# Patient Record
Sex: Female | Born: 1935 | Race: Black or African American | Hispanic: No | Smoking: Never smoker
Health system: Southern US, Community
[De-identification: ages and names within clinical notes are randomized; demographics above are authoritative.]

## PROBLEM LIST (undated history)

## (undated) DIAGNOSIS — F0390 Unspecified dementia without behavioral disturbance: Secondary | ICD-10-CM

## (undated) DIAGNOSIS — I2510 Atherosclerotic heart disease of native coronary artery without angina pectoris: Secondary | ICD-10-CM

## (undated) DIAGNOSIS — F209 Schizophrenia, unspecified: Secondary | ICD-10-CM

## (undated) HISTORY — PX: ABDOMINAL HYSTERECTOMY: SHX81

---

## 2005-07-31 ENCOUNTER — Emergency Department: Payer: Self-pay | Admitting: Emergency Medicine

## 2006-04-08 ENCOUNTER — Emergency Department: Payer: Self-pay | Admitting: Emergency Medicine

## 2006-04-08 ENCOUNTER — Other Ambulatory Visit: Payer: Self-pay

## 2009-03-07 ENCOUNTER — Inpatient Hospital Stay: Payer: Self-pay | Admitting: Internal Medicine

## 2009-06-21 ENCOUNTER — Ambulatory Visit: Payer: Self-pay | Admitting: Internal Medicine

## 2009-10-29 ENCOUNTER — Emergency Department: Payer: Self-pay | Admitting: Internal Medicine

## 2010-06-26 ENCOUNTER — Ambulatory Visit: Payer: Self-pay | Admitting: Internal Medicine

## 2011-01-08 ENCOUNTER — Ambulatory Visit: Payer: Self-pay | Admitting: Internal Medicine

## 2011-02-02 ENCOUNTER — Emergency Department: Payer: Self-pay | Admitting: Unknown Physician Specialty

## 2011-02-10 ENCOUNTER — Observation Stay: Payer: Self-pay | Admitting: Internal Medicine

## 2011-06-20 ENCOUNTER — Ambulatory Visit: Payer: Self-pay | Admitting: Gastroenterology

## 2011-07-04 ENCOUNTER — Ambulatory Visit: Payer: Self-pay | Admitting: Internal Medicine

## 2012-01-07 ENCOUNTER — Ambulatory Visit: Payer: Self-pay | Admitting: Ophthalmology

## 2012-01-07 LAB — POTASSIUM: Potassium: 3.7 mmol/L (ref 3.5–5.1)

## 2012-01-21 ENCOUNTER — Ambulatory Visit: Payer: Self-pay | Admitting: Ophthalmology

## 2012-07-30 ENCOUNTER — Ambulatory Visit: Payer: Self-pay

## 2013-05-14 ENCOUNTER — Emergency Department: Payer: Self-pay | Admitting: Emergency Medicine

## 2013-05-14 LAB — URINALYSIS, COMPLETE
Bacteria: NONE SEEN
Bilirubin,UR: NEGATIVE
Blood: NEGATIVE
Nitrite: NEGATIVE
Ph: 7 (ref 4.5–8.0)
RBC,UR: 1 /HPF (ref 0–5)
Specific Gravity: 1.005 (ref 1.003–1.030)

## 2013-05-14 LAB — CBC
HGB: 12 g/dL (ref 12.0–16.0)
MCH: 30 pg (ref 26.0–34.0)
MCHC: 33.3 g/dL (ref 32.0–36.0)
MCV: 90 fL (ref 80–100)
Platelet: 251 10*3/uL (ref 150–440)
WBC: 5.2 10*3/uL (ref 3.6–11.0)

## 2013-05-14 LAB — TROPONIN I
Troponin-I: <0.02 ng/mL
Troponin-I: <0.02 ng/mL

## 2013-05-14 LAB — COMPREHENSIVE METABOLIC PANEL
Albumin: 3.4 g/dL (ref 3.4–5.0)
Alkaline Phosphatase: 92 U/L (ref 50–136)
Anion Gap: 2 — ABNORMAL LOW (ref 7–16)
Calcium, Total: 8.7 mg/dL (ref 8.5–10.1)
Chloride: 105 mmol/L (ref 98–107)
Creatinine: 0.91 mg/dL (ref 0.60–1.30)
EGFR (African American): >60
Glucose: 82 mg/dL (ref 65–99)
Potassium: 3.6 mmol/L (ref 3.5–5.1)
SGOT(AST): 19 U/L (ref 15–37)
SGPT (ALT): 16 U/L (ref 12–78)
Sodium: 140 mmol/L (ref 136–145)
Total Protein: 7.4 g/dL (ref 6.4–8.2)

## 2013-08-02 ENCOUNTER — Ambulatory Visit: Payer: Self-pay

## 2013-09-15 ENCOUNTER — Emergency Department: Payer: Self-pay

## 2013-09-15 LAB — CBC
HCT: 37.3 % (ref 35.0–47.0)
HGB: 12.1 g/dL (ref 12.0–16.0)
MCV: 91 fL (ref 80–100)
Platelet: 253 10*3/uL (ref 150–440)
RBC: 4.09 10*6/uL (ref 3.80–5.20)
RDW: 12.9 % (ref 11.5–14.5)
WBC: 5.6 10*3/uL (ref 3.6–11.0)

## 2013-09-15 LAB — TROPONIN I: Troponin-I: <0.02 ng/mL

## 2013-09-15 LAB — BASIC METABOLIC PANEL
Anion Gap: 3 — ABNORMAL LOW (ref 7–16)
Creatinine: 0.87 mg/dL (ref 0.60–1.30)
EGFR (African American): >60
Glucose: 111 mg/dL — ABNORMAL HIGH (ref 65–99)
Sodium: 141 mmol/L (ref 136–145)

## 2013-12-23 ENCOUNTER — Observation Stay: Payer: Self-pay | Admitting: Family Medicine

## 2013-12-23 LAB — CBC
HCT: 37.3 % (ref 35.0–47.0)
HGB: 12.2 g/dL (ref 12.0–16.0)
MCH: 29.7 pg (ref 26.0–34.0)
MCHC: 32.6 g/dL (ref 32.0–36.0)
MCV: 91 fL (ref 80–100)
Platelet: 211 10*3/uL (ref 150–440)
RBC: 4.1 10*6/uL (ref 3.80–5.20)
RDW: 12.8 % (ref 11.5–14.5)
WBC: 4.7 10*3/uL (ref 3.6–11.0)

## 2013-12-23 LAB — COMPREHENSIVE METABOLIC PANEL
ALK PHOS: 91 U/L
ALT: 14 U/L (ref 12–78)
AST: 20 U/L (ref 15–37)
Albumin: 3.5 g/dL (ref 3.4–5.0)
Anion Gap: 4 — ABNORMAL LOW (ref 7–16)
BUN: 12 mg/dL (ref 7–18)
Bilirubin,Total: 0.4 mg/dL (ref 0.2–1.0)
CO2: 30 mmol/L (ref 21–32)
Calcium, Total: 8.7 mg/dL (ref 8.5–10.1)
Chloride: 109 mmol/L — ABNORMAL HIGH (ref 98–107)
Creatinine: 0.97 mg/dL (ref 0.60–1.30)
EGFR (African American): >60
EGFR (Non-African Amer.): 53 — ABNORMAL LOW
Glucose: 92 mg/dL (ref 65–99)
OSMOLALITY: 284 (ref 275–301)
Potassium: 4.1 mmol/L (ref 3.5–5.1)
Sodium: 143 mmol/L (ref 136–145)
TOTAL PROTEIN: 7.5 g/dL (ref 6.4–8.2)

## 2013-12-23 LAB — DRUG SCREEN, URINE

## 2013-12-23 LAB — URINALYSIS, COMPLETE
BLOOD: NEGATIVE
Bacteria: NONE SEEN
Bilirubin,UR: NEGATIVE
Glucose,UR: NEGATIVE mg/dL (ref 0–75)
Hyaline Cast: 2
Ketone: NEGATIVE
NITRITE: NEGATIVE
PROTEIN: NEGATIVE
Ph: 5 (ref 4.5–8.0)
RBC,UR: 3 /HPF (ref 0–5)
Specific Gravity: 1.024 (ref 1.003–1.030)
Squamous Epithelial: 7
Transitional Epi: <1

## 2013-12-23 LAB — CK TOTAL AND CKMB (NOT AT ARMC)
CK, TOTAL: 83 U/L
CK, Total: 110 U/L
CK, Total: 83 U/L
CK-MB: 0.9 ng/mL (ref 0.5–3.6)
CK-MB: 0.8 ng/mL (ref 0.5–3.6)
CK-MB: 0.8 ng/mL (ref 0.5–3.6)

## 2013-12-23 LAB — TROPONIN I
Troponin-I: <0.02 ng/mL
Troponin-I: <0.02 ng/mL

## 2013-12-23 LAB — TSH: Thyroid Stimulating Horm: 2.17 u[IU]/mL

## 2013-12-23 LAB — ETHANOL: Ethanol: <3 mg/dL

## 2014-06-10 ENCOUNTER — Emergency Department: Payer: Self-pay | Admitting: Internal Medicine

## 2014-06-10 LAB — COMPREHENSIVE METABOLIC PANEL
ALK PHOS: 88 U/L
ALT: 14 U/L
ANION GAP: 6 — AB (ref 7–16)
Albumin: 3.5 g/dL (ref 3.4–5.0)
BILIRUBIN TOTAL: 0.3 mg/dL (ref 0.2–1.0)
BUN: 18 mg/dL (ref 7–18)
CALCIUM: 8.5 mg/dL (ref 8.5–10.1)
CO2: 28 mmol/L (ref 21–32)
Chloride: 104 mmol/L (ref 98–107)
Creatinine: 1.12 mg/dL (ref 0.60–1.30)
EGFR (African American): 52 — ABNORMAL LOW
GFR CALC NON AF AMER: 44 — AB
GLUCOSE: 119 mg/dL — AB (ref 65–99)
OSMOLALITY: 279 (ref 275–301)
Potassium: 3.7 mmol/L (ref 3.5–5.1)
SGOT(AST): 14 U/L — ABNORMAL LOW (ref 15–37)
Sodium: 138 mmol/L (ref 136–145)
Total Protein: 7.4 g/dL (ref 6.4–8.2)

## 2014-06-10 LAB — CBC
HCT: 36.6 % (ref 35.0–47.0)
HGB: 11.6 g/dL — AB (ref 12.0–16.0)
MCH: 29.5 pg (ref 26.0–34.0)
MCHC: 31.8 g/dL — AB (ref 32.0–36.0)
MCV: 93 fL (ref 80–100)
Platelet: 262 10*3/uL (ref 150–440)
RBC: 3.95 10*6/uL (ref 3.80–5.20)
RDW: 12.8 % (ref 11.5–14.5)
WBC: 6.3 10*3/uL (ref 3.6–11.0)

## 2014-06-10 LAB — TROPONIN I: Troponin-I: <0.02 ng/mL

## 2014-08-31 ENCOUNTER — Ambulatory Visit: Payer: Self-pay

## 2014-11-03 IMAGING — CR CERVICAL SPINE - 2-3 VIEW
1 series · 4 of 4 positions shown · non-contrast
Comparison: None.

CLINICAL DATA: 88-year-old female with neck pain.

EXAM:
CERVICAL SPINE - 2-3 VIEW

[Series 1: dxr c- spine ap and lateral · 0.14mm/px · 4 of 4 slices shown]
[im 1/4]
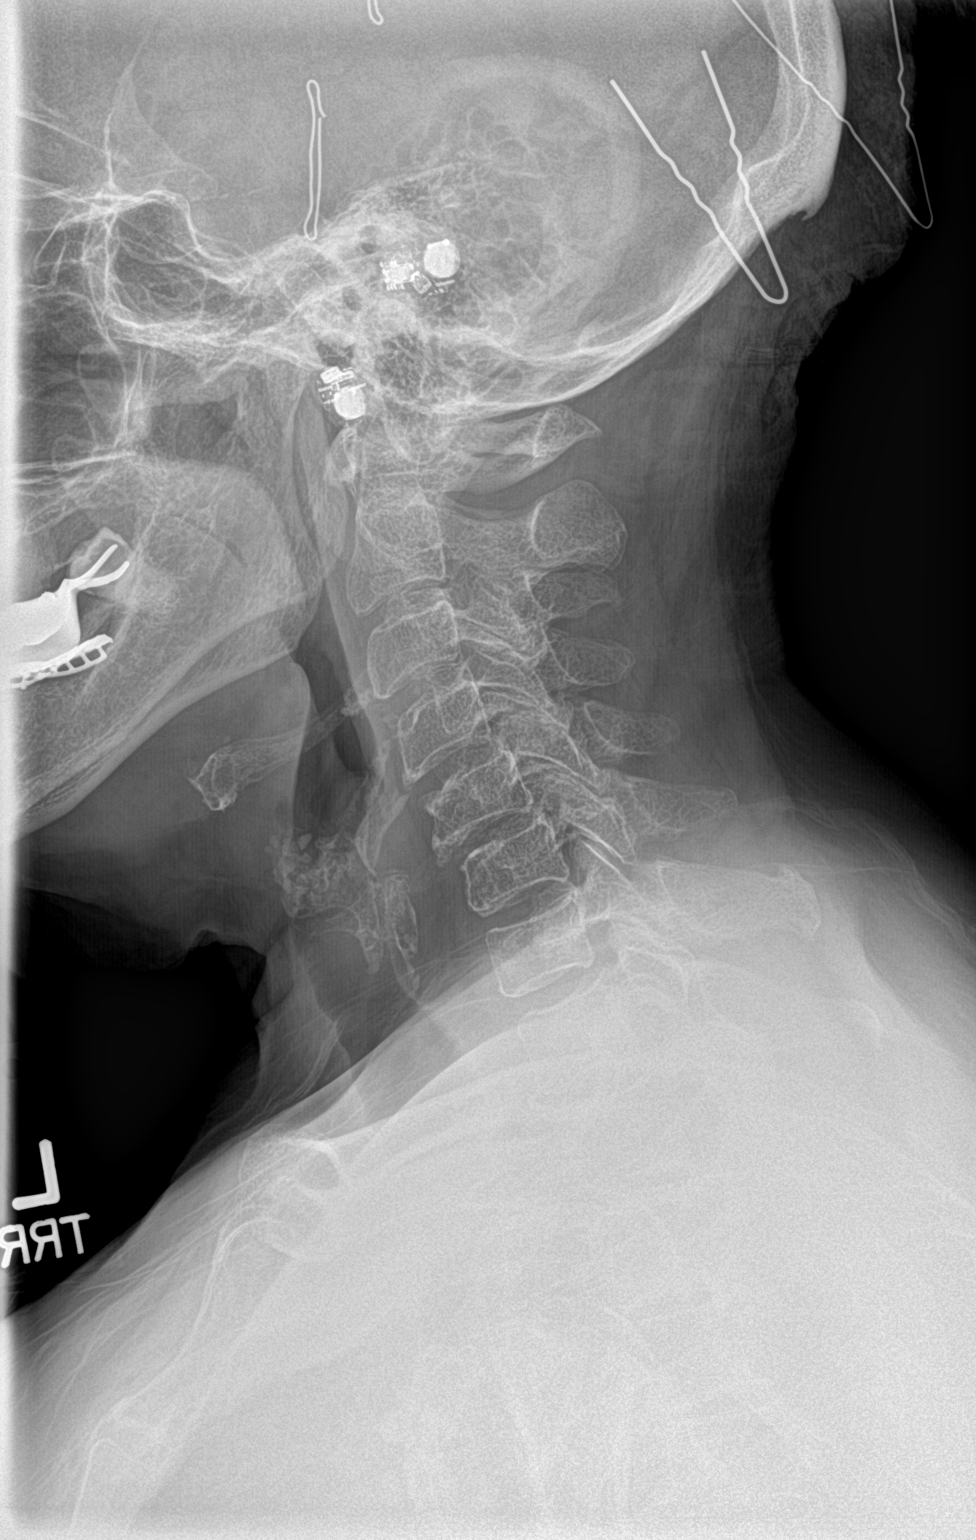
[im 2/4]
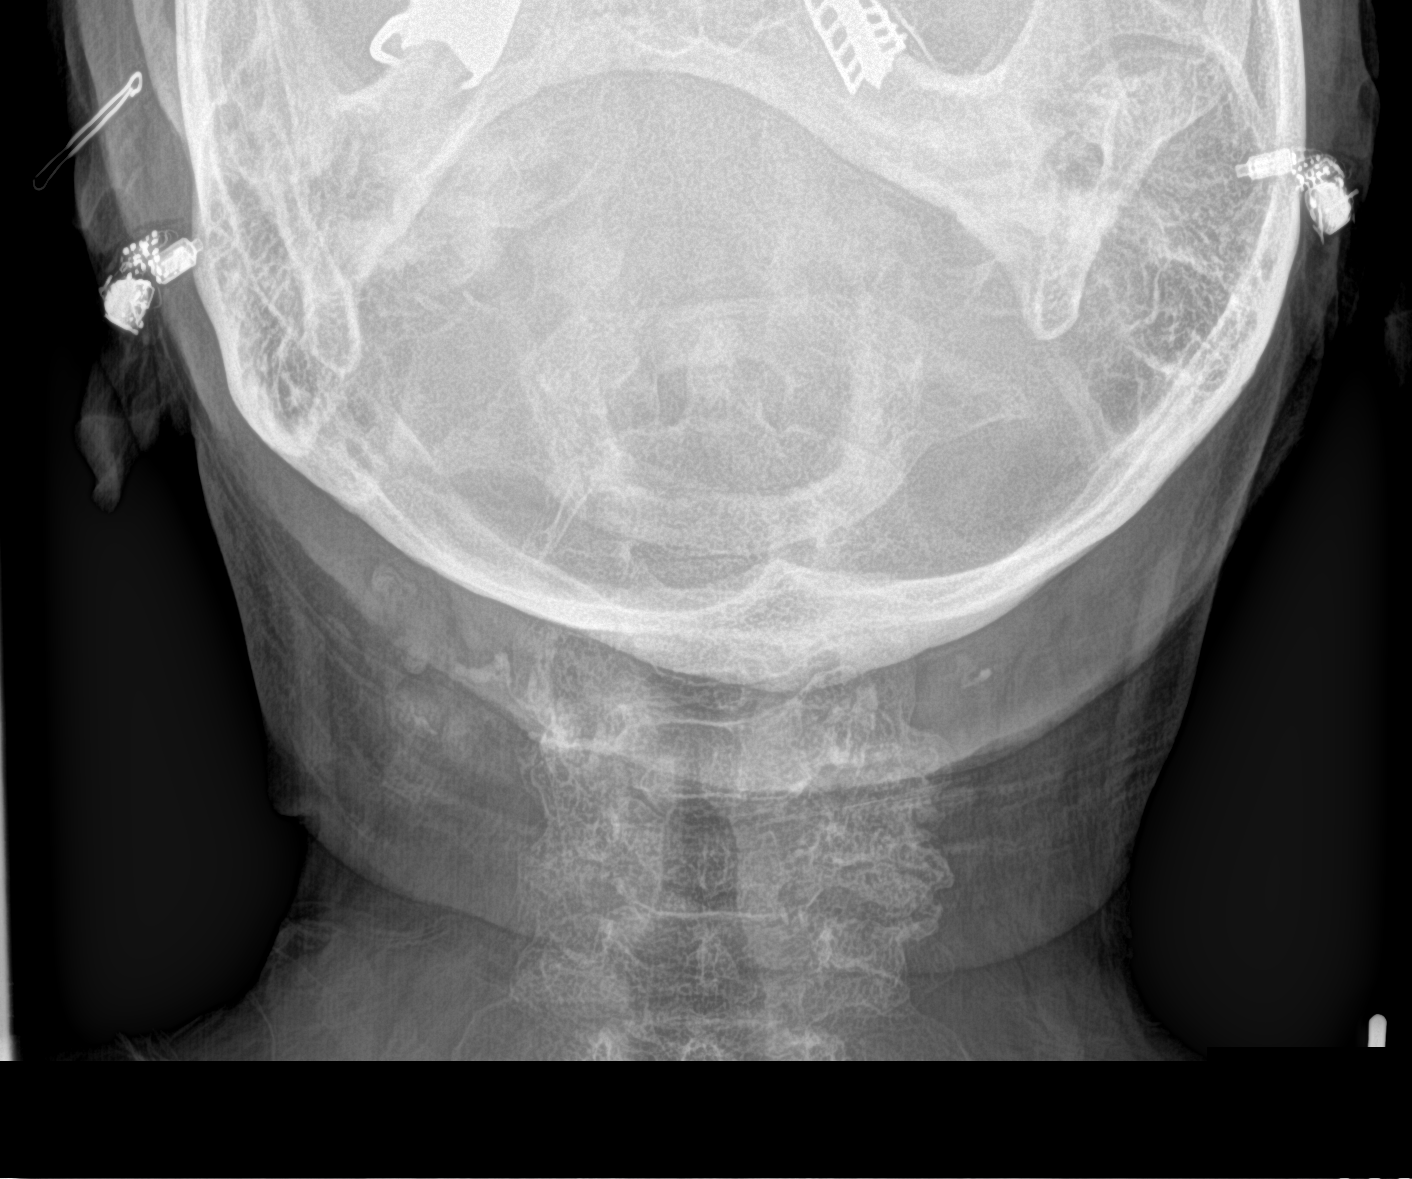
[im 3/4]
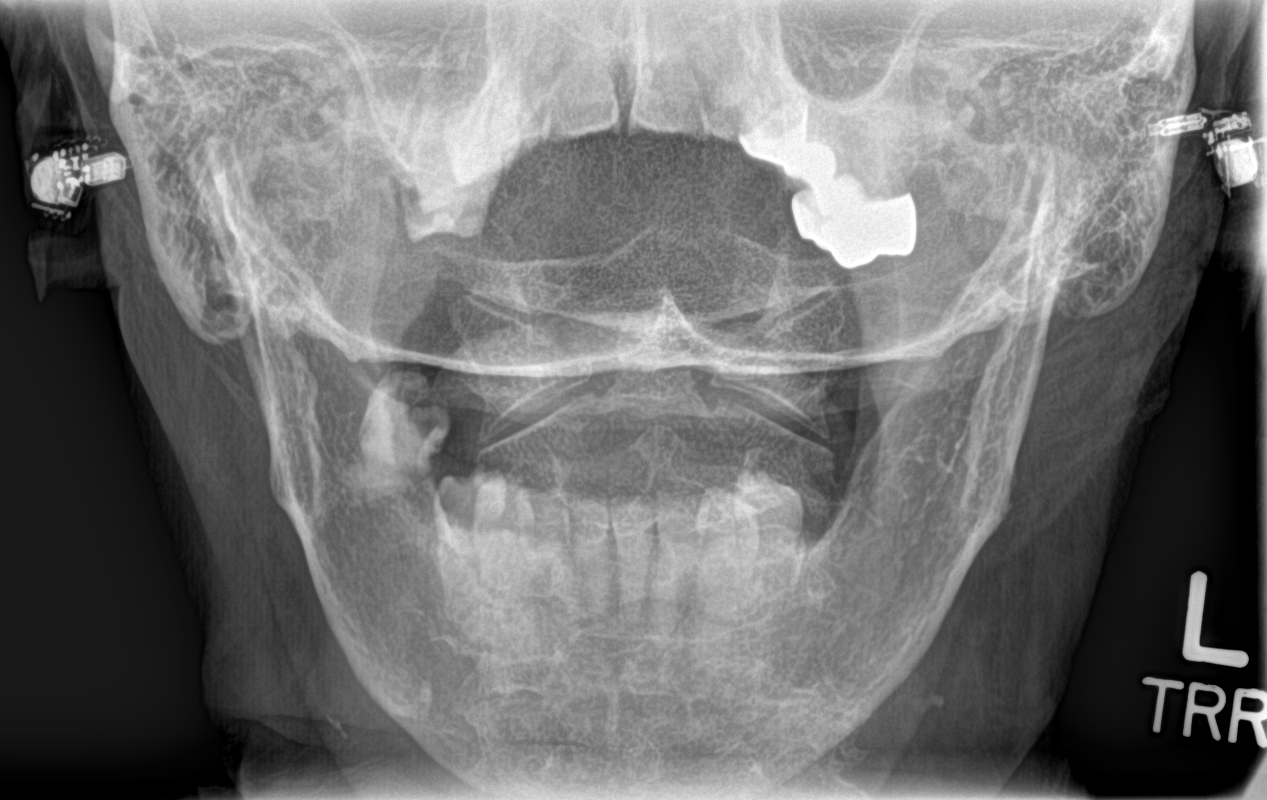
[im 4/4]
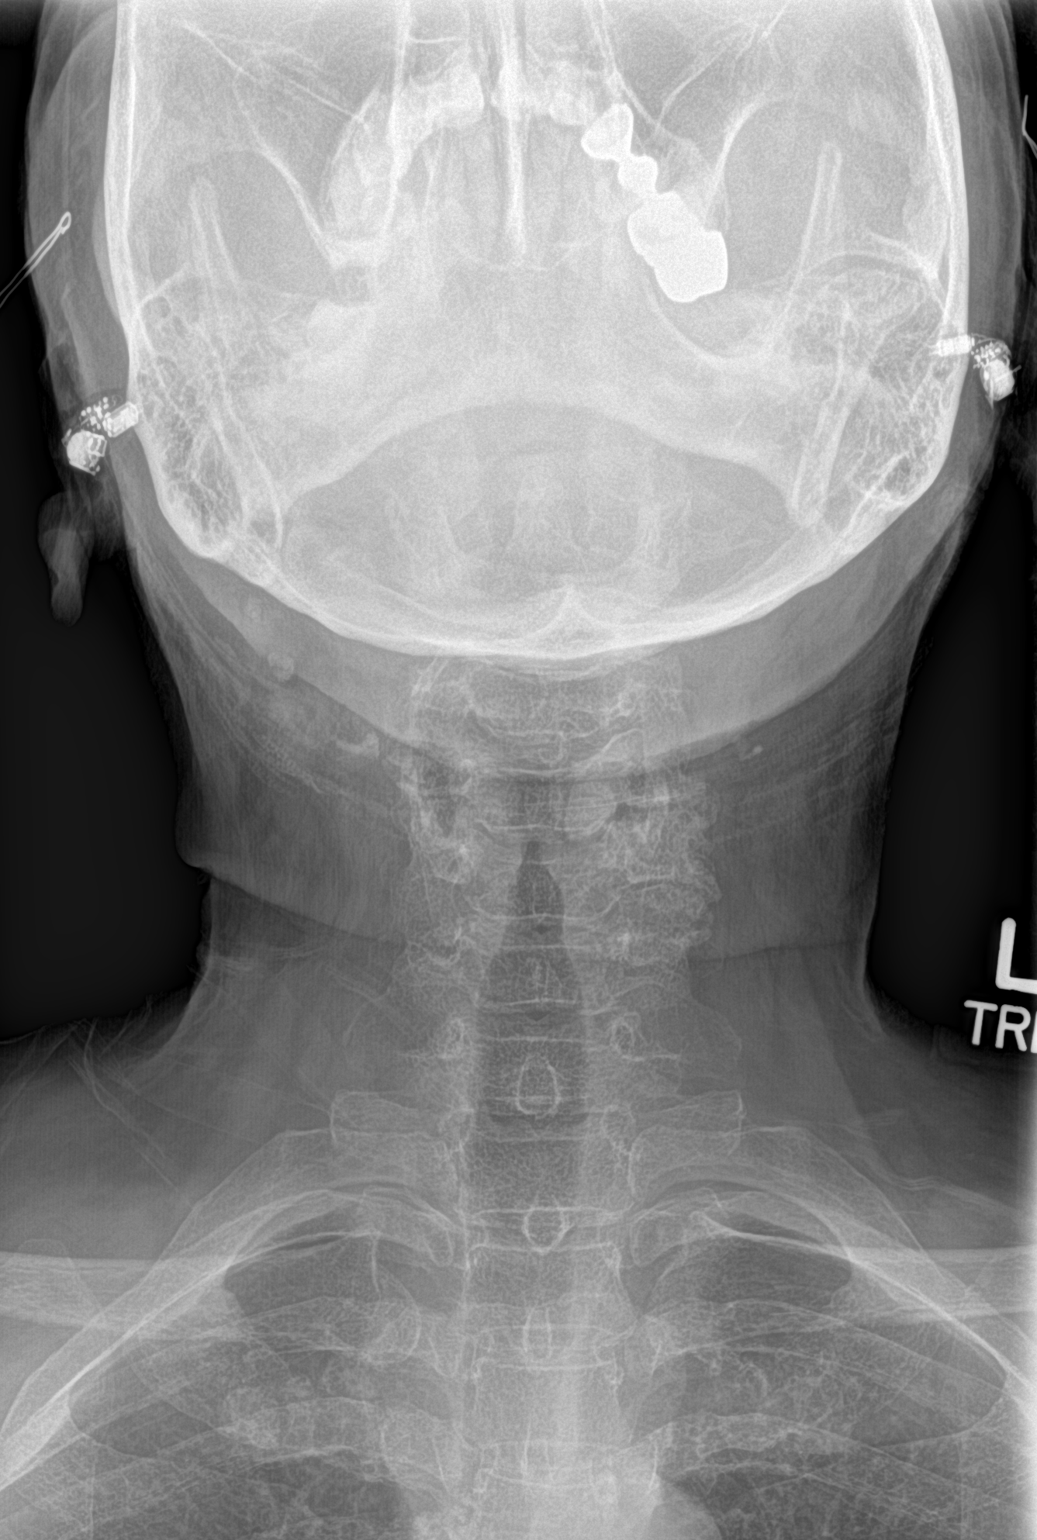

[4 of 4 positions shown; findings below may reference images not displayed]

FINDINGS: Diffuse osteopenia/ osteoporosis noted.

2 mm anterolisthesis of C4 on C5 and C5 on C6 is likely
degenerative.

There is no evidence of acute fracture or prevertebral soft tissue
swelling.

Mild degenerative disc disease at C4-5 and C5-6 noted.

Moderate left facet arthropathy noted from C3-C6.
IMPRESSION: No evidence of acute fracture or prevertebral soft tissue swelling.

Mild degenerative disc disease from C4-C6 with 2 mm
spondylolisthesis C4-5 and C5-6 -likely degenerative.

Moderate left facet arthropathy within the mid cervical spine.

Diffuse osteopenia/ osteoporosis.

## 2015-01-11 ENCOUNTER — Emergency Department
Admit: 2015-01-11 | Disposition: A | Discharge status: Home / Self Care | Payer: Self-pay | Admitting: Emergency Medicine

## 2015-01-11 LAB — CBC
HCT: 35.5 % (ref 35.0–47.0)
HGB: 11.5 g/dL — AB (ref 12.0–16.0)
MCH: 29.6 pg (ref 26.0–34.0)
MCHC: 32.3 g/dL (ref 32.0–36.0)
MCV: 92 fL (ref 80–100)
Platelet: 257 10*3/uL (ref 150–440)
RBC: 3.88 10*6/uL (ref 3.80–5.20)
RDW: 13.1 % (ref 11.5–14.5)
WBC: 4.7 10*3/uL (ref 3.6–11.0)

## 2015-01-11 LAB — BASIC METABOLIC PANEL
Anion Gap: 6 — ABNORMAL LOW (ref 7–16)
BUN: 14 mg/dL
CO2: 30 mmol/L
Calcium, Total: 8.9 mg/dL
Chloride: 106 mmol/L
Creatinine: 0.94 mg/dL
EGFR (African American): >60
GFR CALC NON AF AMER: 55 — AB
GLUCOSE: 111 mg/dL — AB
Potassium: 4 mmol/L
SODIUM: 142 mmol/L

## 2015-01-11 LAB — TROPONIN I
Troponin-I: <0.03 ng/mL
Troponin-I: <0.03 ng/mL

## 2015-01-21 NOTE — Discharge Summary (Signed)
PATIENT NAME:  Veronica Jackson, Veronica Jackson MR#:  [id number] DATE OF BIRTH:  23-Aug-1926  DATE OF ADMISSION:  12/23/2013 DATE OF DISCHARGE:  12/25/2013  DISCHARGE DIAGNOSES: 1.  Noncardiac chest pain.  2.  Dementia. 3.  Psychosis, not otherwise specified.   DISCHARGE MEDICATIONS: 1.  Lovastatin 20 mg p.o. at bedtime.  2.  Montelukast 10 mg p.o. daily.  3.  Atenolol 25 mg p.o. daily. 4.  Loratadine 10 mg p.o. daily.  5.  Furosemide 40 mg p.o. daily.  6.  Vitamin B12 1500 mcg p.o. daily.  7.  Isosorbide mononitrate 30 mg extended-release 1 tab p.o. daily.  8.  Aspirin 81 mg p.o. daily.  9.  Risperidone 0.25 mg at bedtime.  10.  Donepezil 5 mg p.o. at bedtime.   CONSULTATIONS:  Psychiatry and cardiology.   PROCEDURES: None.   PERTINENT LABORATORIES AND STUDIES: Cardiac enzymes negative x 3. Urine drug screen negative. Sodium 143, potassium 4.1, creatinine of 0.97, white blood cell 4.7, hemoglobin 12.2 and platelets 211.   BRIEF HOSPITAL COURSE:  1.  Chest discomfort. The patient came in with chest discomfort with known history of coronary artery disease. Cardiac enzymes are negative x 3. She had no further discomfort during her hospital stay. She was placed on Imdur. She was followed by cardiology who offered catheterization but the patient was not interested in this. She was no interested in any further evaluation. She was able to ambulate without any further chest discomfort. Will continue with aspirin, beta blocker and the Imdur at this time.  2.  Psychosis, dementia. The patient was evaluated by psychiatry who recommended Risperdal and also donepezil at bedtime. We will continue these medications and follow up with Dr. Sampson GoonFitzgerald as an outpatient.   DISPOSITION: She is in stable condition and will be discharged to home.   Follow up with Dr. Sampson GoonFitzgerald in 1 to 2 weeks.    ____________________________ Marisue IvanKanhka Anthany Thornhill, MD kl:ce D: 12/25/2013 09:48:13 ET T: 12/25/2013 19:35:01  ET JOB#: [id number]  cc: Marisue IvanKanhka Treniyah Lynn, MD, <Dictator> Marisue IvanKANHKA Rolanda Campa MD ELECTRONICALLY SIGNED 01/03/2014 10:36
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
## 2015-01-21 NOTE — Consult Note (Signed)
Brief Consult Note: Diagnosis: chest pain. ruled out for an mi.   Patient was seen by consultant.   Comments: Pt with history of chest pain and [facility]. She refused cardiac catheterizaiton at that point. She is now admitted with chest pain . She has ruled out for an mi. No further chest pain. She continues to defer cardiac catheterization. WIll attempt to optimixe meds. Would ambulate and consider dishcarge in am if stable.  Electronic Signatures: Dalia HeadingFath, Doniesha Landau A (MD)  (Signed 27-Mar-15 19:12)  Authored: Brief Consult Note   Last Updated: 27-Mar-15 19:12 by Dalia HeadingFath, Seraphine Gudiel A (MD)
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
## 2015-01-22 NOTE — Op Note (Signed)
PATIENT NAME:  Veronica Jackson, Veronica Jackson MR#:  [id number] DATE OF BIRTH:  [birth year]  DATE OF PROCEDURE:  01/21/2012  PREOPERATIVE DIAGNOSIS: Visually significant cataract of the left eye.   POSTOPERATIVE DIAGNOSIS: Visually significant cataract of the left eye.   OPERATIVE PROCEDURE: Cataract extraction by phacoemulsification with implant of intraocular lens to the left eye.   SURGEON: Galen ManilaWilliam Aimie Wagman, MD  ANESTHESIA:  1. Managed anesthesia care.  2. 50-50 mixture of 0.75% bupivacaine and 4% Xylocaine given as a retrobulbar block.   COMPLICATIONS: None.   TECHNIQUE:  Stop and chop.   DESCRIPTION OF PROCEDURE: The patient was examined and consented for this procedure in the preoperative holding area and then brought back to the Operating Room where the anesthesia team employed managed anesthesia care.  3.5 milliliters of the aforementioned mixture were placed in the left orbit on an Atkinson needle without complication. The left eye was then prepped and draped in the usual sterile ophthalmic fashion. A lid speculum was placed. The side-port blade was used to create a paracentesis and the anterior chamber was filled with viscoelastic. The keratome was used to create a near clear corneal incision. The continuous curvilinear capsulorrhexis was performed with a cystotome followed by the capsulorrhexis forceps. Hydrodissection and hydrodelineation were carried out with BSS on a blunt cannula. The lens was removed in a stop and chop technique. The remaining cortical material was removed with the irrigation-aspiration handpiece. The capsular bag was inflated with viscoelastic and the Technis ZCB00 20.5-diopter lens, serial number [id number] was placed in the capsular bag without complication. The remaining viscoelastic was removed from the eye with the irrigation-aspiration handpiece. The wounds were hydrated. The anterior chamber was flushed with Miostat. The eye was inflated to a physiologic pressure and the  wounds were found to be water tight. The eye was dressed with Vigamox followed by Maxitrol ointment and a protective shield was placed.   The patient will followup with me in one day.   ____________________________ Jerilee FieldWilliam Jackson. Durwin Davisson, MD wlp:cms D: 01/21/2012 12:25:49 ET T: 01/21/2012 12:34:14 ET JOB#: [id number]  cc: Rogue Pautler Jackson. Emerly Prak, MD, <Dictator> Jerilee FieldWILLIAM Jackson Cyenna Rebello MD ELECTRONICALLY SIGNED 01/28/2012 11:41

## 2016-02-08 ENCOUNTER — Encounter: Payer: Self-pay | Admitting: Emergency Medicine

## 2016-02-08 ENCOUNTER — Inpatient Hospital Stay
Admission: EM | Admit: 2016-02-08 | Discharge: 2016-02-11 | DRG: 603 | Disposition: A | Discharge status: Home / Self Care | Payer: Medicare Other | Attending: Internal Medicine | Admitting: Internal Medicine

## 2016-02-08 ENCOUNTER — Emergency Department: Payer: Medicare Other

## 2016-02-08 DIAGNOSIS — N183 Chronic kidney disease, stage 3 unspecified: Secondary | ICD-10-CM

## 2016-02-08 DIAGNOSIS — I2510 Atherosclerotic heart disease of native coronary artery without angina pectoris: Secondary | ICD-10-CM | POA: Diagnosis present

## 2016-02-08 DIAGNOSIS — F209 Schizophrenia, unspecified: Secondary | ICD-10-CM

## 2016-02-08 DIAGNOSIS — L03115 Cellulitis of right lower limb: Secondary | ICD-10-CM | POA: Diagnosis present

## 2016-02-08 DIAGNOSIS — F0390 Unspecified dementia without behavioral disturbance: Secondary | ICD-10-CM | POA: Diagnosis present

## 2016-02-08 DIAGNOSIS — R52 Pain, unspecified: Secondary | ICD-10-CM

## 2016-02-08 DIAGNOSIS — Z79899 Other long term (current) drug therapy: Secondary | ICD-10-CM | POA: Diagnosis not present

## 2016-02-08 DIAGNOSIS — D649 Anemia, unspecified: Secondary | ICD-10-CM

## 2016-02-08 DIAGNOSIS — Z90710 Acquired absence of both cervix and uterus: Secondary | ICD-10-CM | POA: Diagnosis not present

## 2016-02-08 DIAGNOSIS — Z888 Allergy status to other drugs, medicaments and biological substances status: Secondary | ICD-10-CM

## 2016-02-08 DIAGNOSIS — Z7982 Long term (current) use of aspirin: Secondary | ICD-10-CM | POA: Diagnosis not present

## 2016-02-08 DIAGNOSIS — Z8249 Family history of ischemic heart disease and other diseases of the circulatory system: Secondary | ICD-10-CM

## 2016-02-08 DIAGNOSIS — M25571 Pain in right ankle and joints of right foot: Secondary | ICD-10-CM | POA: Diagnosis present

## 2016-02-08 DIAGNOSIS — M7989 Other specified soft tissue disorders: Secondary | ICD-10-CM

## 2016-02-08 HISTORY — DX: Unspecified dementia, unspecified severity, without behavioral disturbance, psychotic disturbance, mood disturbance, and anxiety: F03.90

## 2016-02-08 HISTORY — DX: Atherosclerotic heart disease of native coronary artery without angina pectoris: I25.10

## 2016-02-08 HISTORY — DX: Schizophrenia, unspecified: F20.9

## 2016-02-08 LAB — CBC WITH DIFFERENTIAL/PLATELET
Basophils Absolute: 0 10*3/uL (ref 0–0.1)
Basophils Relative: 1 %
Eosinophils Absolute: 0.1 10*3/uL (ref 0–0.7)
Eosinophils Relative: 2 %
HEMATOCRIT: 34.9 % — AB (ref 35.0–47.0)
HEMOGLOBIN: 11.2 g/dL — AB (ref 12.0–16.0)
LYMPHS ABS: 1.5 10*3/uL (ref 1.0–3.6)
LYMPHS PCT: 21 %
MCH: 29.2 pg (ref 26.0–34.0)
MCHC: 32.2 g/dL (ref 32.0–36.0)
MCV: 90.8 fL (ref 80.0–100.0)
Monocytes Absolute: 0.9 10*3/uL (ref 0.2–0.9)
Monocytes Relative: 12 %
NEUTROS ABS: 4.5 10*3/uL (ref 1.4–6.5)
NEUTROS PCT: 64 %
Platelets: 267 10*3/uL (ref 150–440)
RBC: 3.84 MIL/uL (ref 3.80–5.20)
RDW: 12.6 % (ref 11.5–14.5)
WBC: 7 10*3/uL (ref 3.6–11.0)

## 2016-02-08 LAB — BASIC METABOLIC PANEL
ANION GAP: 6 (ref 5–15)
BUN: 19 mg/dL (ref 6–20)
CHLORIDE: 105 mmol/L (ref 101–111)
CO2: 30 mmol/L (ref 22–32)
Calcium: 9.1 mg/dL (ref 8.9–10.3)
Creatinine, Ser: 1.19 mg/dL — ABNORMAL HIGH (ref 0.44–1.00)
GFR calc non Af Amer: 40 mL/min — ABNORMAL LOW (ref >60)
GFR, EST AFRICAN AMERICAN: 46 mL/min — AB (ref >60)
Glucose, Bld: 79 mg/dL (ref 65–99)
POTASSIUM: 4 mmol/L (ref 3.5–5.1)
SODIUM: 141 mmol/L (ref 135–145)

## 2016-02-08 LAB — MRSA PCR SCREENING: MRSA by PCR: NEGATIVE

## 2016-02-08 MED ORDER — ONDANSETRON HCL 4 MG/2ML IJ SOLN: 4.0000 mg | Freq: Four times a day (QID) | INTRAMUSCULAR | Status: DC | PRN

## 2016-02-08 MED ORDER — CLINDAMYCIN PHOSPHATE 600 MG/50ML IV SOLN
600.0000 mg | Freq: Once | INTRAVENOUS | Status: AC
  Administered 2016-02-08: 600 mg via INTRAVENOUS
  Filled 2016-02-08: qty 50

## 2016-02-08 MED ORDER — ONDANSETRON HCL 4 MG PO TABS: 4.0000 mg | ORAL_TABLET | Freq: Four times a day (QID) | ORAL | Status: DC | PRN

## 2016-02-08 MED ORDER — PIPERACILLIN-TAZOBACTAM 3.375 G IVPB 30 MIN: 3.3750 g | INTRAVENOUS | Status: DC

## 2016-02-08 MED ORDER — PRAVASTATIN SODIUM 20 MG PO TABS
20.0000 mg | ORAL_TABLET | Freq: Every day | ORAL | Status: DC
  Administered 2016-02-09 – 2016-02-10 (×2): 20 mg via ORAL
  Filled 2016-02-08 (×2): qty 1

## 2016-02-08 MED ORDER — DONEPEZIL HCL 5 MG PO TABS
10.0000 mg | ORAL_TABLET | Freq: Every day | ORAL | Status: DC
  Administered 2016-02-09 – 2016-02-11 (×3): 10 mg via ORAL
  Filled 2016-02-08 (×3): qty 2

## 2016-02-08 MED ORDER — BIOTENE DRY MOUTH MT LIQD
10.0000 mL | Freq: Two times a day (BID) | OROMUCOSAL | Status: DC
  Administered 2016-02-08 – 2016-02-11 (×4): 10 mL via OROMUCOSAL

## 2016-02-08 MED ORDER — PIPERACILLIN-TAZOBACTAM 3.375 G IVPB
3.3750 g | Freq: Three times a day (TID) | INTRAVENOUS | Status: DC
  Administered 2016-02-08 – 2016-02-11 (×8): 3.375 g via INTRAVENOUS
  Filled 2016-02-08 (×11): qty 50

## 2016-02-08 MED ORDER — NITROGLYCERIN 0.4 MG SL SUBL: 0.4000 mg | SUBLINGUAL_TABLET | SUBLINGUAL | Status: DC | PRN

## 2016-02-08 MED ORDER — MAGNESIUM HYDROXIDE 400 MG/5ML PO SUSP: 30.0000 mL | Freq: Every day | ORAL | Status: DC | PRN

## 2016-02-08 MED ORDER — ISOSORBIDE MONONITRATE ER 60 MG PO TB24
120.0000 mg | ORAL_TABLET | Freq: Every day | ORAL | Status: DC
  Administered 2016-02-09 – 2016-02-11 (×3): 120 mg via ORAL
  Filled 2016-02-08 (×3): qty 2

## 2016-02-08 MED ORDER — ALBUTEROL SULFATE (2.5 MG/3ML) 0.083% IN NEBU: 2.5000 mg | INHALATION_SOLUTION | RESPIRATORY_TRACT | Status: DC | PRN

## 2016-02-08 MED ORDER — MONTELUKAST SODIUM 10 MG PO TABS
10.0000 mg | ORAL_TABLET | Freq: Every day | ORAL | Status: DC
  Administered 2016-02-08 – 2016-02-10 (×3): 10 mg via ORAL
  Filled 2016-02-08 (×3): qty 1

## 2016-02-08 MED ORDER — MEMANTINE HCL 10 MG PO TABS
5.0000 mg | ORAL_TABLET | Freq: Two times a day (BID) | ORAL | Status: DC
  Administered 2016-02-08 – 2016-02-11 (×6): 5 mg via ORAL
  Filled 2016-02-08 (×6): qty 1

## 2016-02-08 MED ORDER — OXYCODONE-ACETAMINOPHEN 5-325 MG PO TABS
1.0000 | ORAL_TABLET | ORAL | Status: DC | PRN
  Administered 2016-02-08 – 2016-02-11 (×9): 1 via ORAL
  Filled 2016-02-08 (×10): qty 1

## 2016-02-08 MED ORDER — ACETAMINOPHEN 650 MG RE SUPP: 650.0000 mg | Freq: Four times a day (QID) | RECTAL | Status: DC | PRN

## 2016-02-08 MED ORDER — LORATADINE 10 MG PO TABS
5.0000 mg | ORAL_TABLET | Freq: Every day | ORAL | Status: DC
  Administered 2016-02-09 – 2016-02-11 (×3): 5 mg via ORAL
  Filled 2016-02-08 (×3): qty 1

## 2016-02-08 MED ORDER — SENNA 8.6 MG PO TABS
1.0000 | ORAL_TABLET | Freq: Every day | ORAL | Status: DC
  Administered 2016-02-08 – 2016-02-10 (×3): 8.6 mg via ORAL
  Filled 2016-02-08 (×3): qty 1

## 2016-02-08 MED ORDER — FUROSEMIDE 40 MG PO TABS
40.0000 mg | ORAL_TABLET | ORAL | Status: DC
  Administered 2016-02-10: 40 mg via ORAL
  Filled 2016-02-08: qty 1

## 2016-02-08 MED ORDER — VANCOMYCIN HCL IN DEXTROSE 750-5 MG/150ML-% IV SOLN
750.0000 mg | INTRAVENOUS | Status: DC
  Administered 2016-02-09 – 2016-02-10 (×2): 750 mg via INTRAVENOUS
  Filled 2016-02-08 (×3): qty 150

## 2016-02-08 MED ORDER — ATENOLOL 25 MG PO TABS
25.0000 mg | ORAL_TABLET | Freq: Every day | ORAL | Status: DC
  Administered 2016-02-09 – 2016-02-10 (×2): 25 mg via ORAL
  Filled 2016-02-08 (×3): qty 1

## 2016-02-08 MED ORDER — MORPHINE SULFATE (PF) 2 MG/ML IV SOLN: 2.0000 mg | INTRAVENOUS | Status: DC | PRN

## 2016-02-08 MED ORDER — ENOXAPARIN SODIUM 30 MG/0.3ML ~~LOC~~ SOLN
30.0000 mg | SUBCUTANEOUS | Status: DC
  Administered 2016-02-08 – 2016-02-10 (×3): 30 mg via SUBCUTANEOUS
  Filled 2016-02-08 (×3): qty 0.3

## 2016-02-08 MED ORDER — VANCOMYCIN HCL IN DEXTROSE 1-5 GM/200ML-% IV SOLN: 1000.0000 mg | INTRAVENOUS | Status: AC

## 2016-02-08 MED ORDER — ACETAMINOPHEN 325 MG PO TABS: 650.0000 mg | ORAL_TABLET | Freq: Four times a day (QID) | ORAL | Status: DC | PRN

## 2016-02-08 MED ORDER — GUAIFENESIN 100 MG/5ML PO SYRP
400.0000 mg | ORAL_SOLUTION | Freq: Four times a day (QID) | ORAL | Status: DC | PRN
  Filled 2016-02-08: qty 20

## 2016-02-08 MED ORDER — ASPIRIN 81 MG PO CHEW
81.0000 mg | CHEWABLE_TABLET | Freq: Every day | ORAL | Status: DC
Start: 2016-02-09 — End: 2016-02-11
  Administered 2016-02-09 – 2016-02-11 (×3): 81 mg via ORAL
  Filled 2016-02-08 (×3): qty 1

## 2016-02-08 NOTE — H&P (Signed)
[facility] -  at [facility]   PATIENT NAME: Veronica Jackson    MR#:  [id number]  DATE OF BIRTH:  09/02/1926  DATE OF ADMISSION:  02/08/2016  PRIMARY CARE PHYSICIAN: Brandy Hale, MD   REQUESTING/REFERRING PHYSICIAN: Governor Rooks, MD  CHIEF COMPLAINT:   Chief Complaint  Patient presents with  . Leg Pain    HISTORY OF PRESENT ILLNESS:  Veronica Jackson  is a 82 y.o. female with a known history of Dementia, schizophrenia and atherosclerotic cardiovascular disease. The patient complains of worsening right leg pain and the swelling for the past several days. She was treated with Bactrim which she is allergic to and changed to Keflex. She complains of severe left leg pain and swelling for the past 2 days. She denies any fever or chills. No chest pain, palpitation or shortness of breath. Right leg venous ultrasound didn't show any DVT. She is found to have right leg cellulitis and treated with clindamycin ED.  PAST MEDICAL HISTORY:   Past Medical History  Diagnosis Date  . Dementia   . Schizophrenia (HCC)   . Atherosclerotic cardiovascular disease     PAST SURGICAL HISTORY:   Past Surgical History  Procedure Laterality Date  . Abdominal hysterectomy      SOCIAL HISTORY:   Social History  Substance Use Topics  . Smoking status: Never Smoker   . Smokeless tobacco: Never Used  . Alcohol Use: No    FAMILY HISTORY:   Family History  Problem Relation Age of Onset  . Heart attack Mother     DRUG ALLERGIES:   Allergies  Allergen Reactions  . Bactrim [Sulfamethoxazole-Trimethoprim]   . Rhinocort [Budesonide]     REVIEW OF SYSTEMS:  CONSTITUTIONAL: No fever, fatigue or weakness.  EYES: No blurred or double vision.  EARS, NOSE, AND THROAT: No tinnitus or ear pain.  RESPIRATORY: No cough, shortness of breath, wheezing or hemoptysis.  CARDIOVASCULAR: No chest pain, orthopnea, edema.  GASTROINTESTINAL: No nausea, vomiting, diarrhea or  abdominal pain.  GENITOURINARY: No dysuria, hematuria.  ENDOCRINE: No polyuria, nocturia,  HEMATOLOGY: No anemia, easy bruising or bleeding SKIN: No rash or lesion. MUSCULOSKELETAL: Right leg pain and the swelling.   NEUROLOGIC: No tingling, numbness, weakness.  PSYCHIATRY: No anxiety or depression.   MEDICATIONS AT HOME:   Prior to Admission medications   Medication Sig Start Date End Date Taking? Authorizing Provider  acetaminophen (TYLENOL) 325 MG tablet Take 650 mg by mouth every 8 (eight) hours as needed for mild pain, moderate pain or fever.   Yes Historical Provider, MD  antiseptic oral rinse (BIOTENE) LIQD 10 mLs by Mouth Rinse route 2 (two) times daily. Swish and spit   Yes Historical Provider, MD  aspirin 81 MG chewable tablet Chew 81 mg by mouth daily.   Yes Historical Provider, MD  atenolol (TENORMIN) 25 MG tablet Take 25 mg by mouth daily.   Yes Historical Provider, MD  cephALEXin (KEFLEX) 500 MG capsule Take 500 mg by mouth 4 (four) times daily. For 7 days 02/07/16 02/14/16 Yes Historical Provider, MD  donepezil (ARICEPT) 10 MG tablet Take 10 mg by mouth daily.   Yes Historical Provider, MD  furosemide (LASIX) 20 MG tablet Take 40 mg by mouth every other day.   Yes Historical Provider, MD  guaifenesin (ROBITUSSIN) 100 MG/5ML syrup Take 400 mg by mouth every 6 (six) hours as needed for cough.   Yes Historical Provider, MD  Homeopathic Products (ARNICARE ARNICA) CREA Apply topically 2 (two) times  daily as needed. To right lower leg for pain   Yes Historical Provider, MD  isosorbide mononitrate (IMDUR) 60 MG 24 hr tablet Take 120 mg by mouth daily.   Yes Historical Provider, MD  loratadine (CLARITIN) 10 MG tablet Take 5 mg by mouth daily.   Yes Historical Provider, MD  lovastatin (MEVACOR) 20 MG tablet Take 20 mg by mouth daily with supper.   Yes Historical Provider, MD  magnesium hydroxide (MILK OF MAGNESIA) 400 MG/5ML suspension Take 30 mLs by mouth daily as needed for mild  constipation or moderate constipation.   Yes Historical Provider, MD  memantine (NAMENDA) 5 MG tablet Take 5 mg by mouth 2 (two) times daily.   Yes Historical Provider, MD  Menthol (HALLS COUGH DROPS) 9.1 MG LOZG Use as directed 1 lozenge in the mouth or throat 4 (four) times daily as needed. For cough or sore throat   Yes Historical Provider, MD  montelukast (SINGULAIR) 10 MG tablet Take 10 mg by mouth at bedtime.   Yes Historical Provider, MD  nitroGLYCERIN (NITROSTAT) 0.4 MG SL tablet Place 0.4 mg under the tongue every 5 (five) minutes as needed for chest pain.   Yes Historical Provider, MD  senna (SENOKOT) 8.6 MG TABS tablet Take 1 tablet by mouth at bedtime.   Yes Historical Provider, MD      VITAL SIGNS:  Blood pressure 125/63, pulse 65, temperature 98.9 F (37.2 C), temperature source Oral, resp. rate 20, height 4\' 11"  (1.499 m), weight 77.111 kg (170 lb), SpO2 97 %.  PHYSICAL EXAMINATION:  GENERAL:  82 y.o.-year-old patient lying in the bed with no acute distress.  EYES: Pupils equal, round, reactive to light and accommodation. No scleral icterus. Extraocular muscles intact.  HEENT: Head atraumatic, normocephalic. Oropharynx and nasopharynx clear.  NECK:  Supple, no jugular venous distention. No thyroid enlargement, no tenderness.  LUNGS: Normal breath sounds bilaterally, no wheezing, rales,rhonchi or crepitation. No use of accessory muscles of respiration.  CARDIOVASCULAR: S1, S2 normal. No murmurs, rubs, or gallops.  ABDOMEN: Soft, nontender, nondistended. Bowel sounds present. No organomegaly or mass.  EXTREMITIES: No cyanosis, or clubbing. Tenderness, erythema and swelling on right lower extremity and under knee and about ankle. NEUROLOGIC: Cranial nerves II through XII are intact. Muscle strength 5/5 in all extremities. Sensation intact. Gait not checked.  PSYCHIATRIC: The patient is alert and oriented x 3.  SKIN: No obvious rash, lesion, or ulcer.   LABORATORY PANEL:    CBC  Recent Labs Lab 02/08/16 1210  WBC 7.0  HGB 11.2*  HCT 34.9*  PLT 267   ------------------------------------------------------------------------------------------------------------------  Chemistries   Recent Labs Lab 02/08/16 1210  NA 141  K 4.0  CL 105  CO2 30  GLUCOSE 79  BUN 19  CREATININE 1.19*  CALCIUM 9.1   ------------------------------------------------------------------------------------------------------------------  Cardiac Enzymes No results for input(s): TROPONINI in the last 168 hours. ------------------------------------------------------------------------------------------------------------------  RADIOLOGY:  [place] Venous Img Lower Unilateral Right  02/08/2016  CLINICAL DATA:  Right leg pain and swelling for 1 month the EXAM: Right LOWER EXTREMITY VENOUS DOPPLER ULTRASOUND TECHNIQUE: [place]-scale sonography with graded compression, as well as color Doppler and duplex ultrasound were performed to evaluate the lower extremity deep venous systems from the level of the common femoral vein and including the common femoral, femoral, profunda femoral, popliteal and calf veins including the posterior tibial, peroneal and gastrocnemius veins when visible. The superficial great saphenous vein was also interrogated. Spectral Doppler was utilized to evaluate flow at rest and with distal  augmentation maneuvers in the common femoral, femoral and popliteal veins. COMPARISON:  None. FINDINGS: Contralateral Common Femoral Vein: Respiratory phasicity is normal and symmetric with the symptomatic side. No evidence of thrombus. Normal compressibility. Common Femoral Vein: No evidence of thrombus. Normal compressibility, respiratory phasicity and response to augmentation. Saphenofemoral Junction: No evidence of thrombus. Normal compressibility and flow on color Doppler imaging. Profunda Femoral Vein: No evidence of thrombus. Normal compressibility and flow on color Doppler imaging.  Femoral Vein: No evidence of thrombus. Normal compressibility, respiratory phasicity and response to augmentation. Popliteal Vein: No evidence of thrombus. Normal compressibility, respiratory phasicity and response to augmentation. Calf Veins: Peroneal vein is not visualized. Posterior tibial vein shows no thrombus. Superficial Great Saphenous Vein: No evidence of thrombus. Normal compressibility and flow on color Doppler imaging. Venous Reflux:  None. Other Findings:  None. IMPRESSION: No evidence of deep venous thrombosis. Electronically Signed   By: Alcide Clever M.D.   On: 02/08/2016 13:22    EKG:   Orders placed or performed in visit on 06/10/14  . EKG 12-Lead  . EKG 12-Lead    IMPRESSION AND PLAN:   Right leg severe cellulitis The patient will be admitted to medical floor. She was treated with 1 dose of clindamycin in the ED. I will start Zosyn and vancomycin pharmacy to dose, follow-up CBC. Pain control.  atherosclerotic cardiovascular disease. Continue aspirin, Pravachol, imdur and atenolol. Dementia. Continue dementia medication.    All the records are reviewed and case discussed with ED provider. Management plans discussed with the patient, family and they are in agreement.  CODE STATUS: Full code  TOTAL TIME TAKING CARE OF THIS PATIENT: 53  minutes.    Shaune Pollack M.D on 02/08/2016 at 4:39 PM  Between 7am to 6pm - Pager - [phone]  After 6pm go to [email or web address] - password [email or web address] [facility]  [place] [facility]  Office  [phone]  CC: Primary care physician; Brandy Hale, MD

## 2016-02-08 NOTE — ED Notes (Signed)
Report given to Amanda, RN

## 2016-02-08 NOTE — ED Notes (Signed)
Pt assisted to the bathroom at this time with no difficulty. Pt noted to be mildly unsteady on feet but is able to walk without assistance. Pt denies further needs at this time. Will continue to monitor for further patient needs.

## 2016-02-08 NOTE — Progress Notes (Signed)
Anticoagulation monitoring(Lovenox):  82 yo F ordered Lovenox 40 mg Q24h  Filed Weights   02/08/16 1119  Weight: 170 lb (77.111 kg)   BMI 34.4  Lab Results  Component Value Date   CREATININE 1.19* 02/08/2016   CREATININE 0.94 01/11/2015   CREATININE 1.12 06/10/2014   Estimated Creatinine Clearance: 29.3 mL/min (by C-G formula based on Cr of 1.19). Hemoglobin & Hematocrit     Component Value Date/Time   HGB 11.2* 02/08/2016 1210   HGB 11.5* 01/11/2015 1109   HCT 34.9* 02/08/2016 1210   HCT 35.5 01/11/2015 1109     Per Protocol for Patient with estCrcl< 30 ml/min and BMI < 40, will transition to Lovenox 30 mg Q24h.      Luisa HartScott Breyah Akhter, PharmD Clinical Pharmacist

## 2016-02-08 NOTE — ED Provider Notes (Signed)
[facility] Emergency Department Provider Note   ____________________________________________  Time seen:  I have reviewed the triage vital signs and the triage nursing note.  HISTORY  Chief Complaint Leg Pain   Historian Patient  HPI Veronica Jackson is a 82 y.o. female who stays in a facility due to dementia and schizophrenia, is here and a relatively good historian, reporting that she has had swelling and pain to her right leg for several days now. She states that she was placed on Bactrim which is a sulfa drug that she is allergic to, and that was switched to Keflex. It appears based on her medication list that she is probably been on it for about 2 days now.  She's had worsening pain and swelling to her right lower extremity. She reports some hot sweats but no documented fevers or chills.    Past Medical History  Diagnosis Date  . Dementia   . Schizophrenia (HCC)   . Atherosclerotic cardiovascular disease     There are no active problems to display for this patient.   Past Surgical History  Procedure Laterality Date  . Abdominal hysterectomy      Current Outpatient Rx  Name  Route  Sig  Dispense  Refill  . acetaminophen (TYLENOL) 325 MG tablet   Oral   Take 650 mg by mouth every 8 (eight) hours as needed for mild pain, moderate pain or fever.         Marland Kitchen antiseptic oral rinse (BIOTENE) LIQD   Mouth Rinse   10 mLs by Mouth Rinse route 2 (two) times daily. Swish and spit         . aspirin 81 MG chewable tablet   Oral   Chew 81 mg by mouth daily.         Marland Kitchen atenolol (TENORMIN) 25 MG tablet   Oral   Take 25 mg by mouth daily.         . cephALEXin (KEFLEX) 500 MG capsule   Oral   Take 500 mg by mouth 4 (four) times daily. For 7 days         . donepezil (ARICEPT) 10 MG tablet   Oral   Take 10 mg by mouth daily.         . furosemide (LASIX) 20 MG tablet   Oral   Take 40 mg by mouth every other day.         . guaifenesin  (ROBITUSSIN) 100 MG/5ML syrup   Oral   Take 400 mg by mouth every 6 (six) hours as needed for cough.         . Homeopathic Products (ARNICARE ARNICA) CREA   Apply externally   Apply topically 2 (two) times daily as needed. To right lower leg for pain         . isosorbide mononitrate (IMDUR) 60 MG 24 hr tablet   Oral   Take 120 mg by mouth daily.         Marland Kitchen loratadine (CLARITIN) 10 MG tablet   Oral   Take 5 mg by mouth daily.         Marland Kitchen lovastatin (MEVACOR) 20 MG tablet   Oral   Take 20 mg by mouth daily with supper.         . magnesium hydroxide (MILK OF MAGNESIA) 400 MG/5ML suspension   Oral   Take 30 mLs by mouth daily as needed for mild constipation or moderate constipation.         Marland Kitchen  memantine (NAMENDA) 5 MG tablet   Oral   Take 5 mg by mouth 2 (two) times daily.         . Menthol (HALLS COUGH DROPS) 9.1 MG LOZG   Mouth/Throat   Use as directed 1 lozenge in the mouth or throat 4 (four) times daily as needed. For cough or sore throat         . montelukast (SINGULAIR) 10 MG tablet   Oral   Take 10 mg by mouth at bedtime.         . nitroGLYCERIN (NITROSTAT) 0.4 MG SL tablet   Sublingual   Place 0.4 mg under the tongue every 5 (five) minutes as needed for chest pain.         Marland Kitchen. senna (SENOKOT) 8.6 MG TABS tablet   Oral   Take 1 tablet by mouth at bedtime.           Allergies Bactrim and Rhinocort  History reviewed. No pertinent family history.  Social History Social History  Substance Use Topics  . Smoking status: Never Smoker   . Smokeless tobacco: Never Used  . Alcohol Use: No    Review of Systems  Constitutional: Negative for fever. Eyes: Negative for visual changes. ENT: Negative for sore throat. Cardiovascular: Negative for chest pain. Respiratory: Negative for shortness of breath. Gastrointestinal: Negative for abdominal pain, vomiting and diarrhea. Genitourinary: Negative for dysuria. Musculoskeletal: Negative for back  pain. Skin: Redness of the right lower extremity Neurological: Negative for headache. 10 point Review of Systems otherwise negative ____________________________________________   PHYSICAL EXAM:  VITAL SIGNS: ED Triage Vitals  Enc Vitals Group     BP 02/08/16 1119 139/67 mmHg     Pulse Rate 02/08/16 1119 57     Resp 02/08/16 1119 20     Temp 02/08/16 1119 98.9 F (37.2 C)     Temp Source 02/08/16 1119 Oral     SpO2 02/08/16 1116 97 %     Weight 02/08/16 1119 170 lb (77.111 kg)     Height 02/08/16 1119 4\' 11"  (1.499 m)     Head Cir --      Peak Flow --      Pain Score --      Pain Loc --      Pain Edu? --      Excl. in GC? --      Constitutional: Alert and oriented. Well appearing and in no distress. HEENT   Head: Normocephalic and atraumatic.      Eyes: Conjunctivae are normal. PERRL. Normal extraocular movements.      Ears:         Nose: No congestion/rhinnorhea.   Mouth/Throat: Mucous membranes are moist.   Neck: No stridor. Cardiovascular/Chest: Normal rate, regular rhythm.  No murmurs, rubs, or gallops. Respiratory: Normal respiratory effort without tachypnea nor retractions. Breath sounds are clear and equal bilaterally. No wheezes/rales/rhonchi. Gastrointestinal: Soft. No distention, no guarding, no rebound. Nontender.    Genitourinary/rectal:Deferred Musculoskeletal: Right lower extremity swollen from the foot midway up the calf with pitting edema, redness, and tenderness to palpation. Neurologic:  Normal speech and language. No gross or focal neurologic deficits are appreciated. Skin:  Skin is warm, dry and intact. No rash noted. Psychiatric: Speech and behavior are normal.  ____________________________________________   EKG I, Governor Rooksebecca Kasiyah Platter, MD, the attending physician have personally viewed and interpreted all ECGs.  None ____________________________________________  LABS (pertinent positives/negatives)  Basic metabolic panel significant for  creatinine 1.19 White blood count 7.0,  hemoglobin 11.2 and platelet count 267  ____________________________________________  RADIOLOGY All Xrays were viewed by me. Imaging interpreted by Radiologist.  Right lower extremity ultrasound: No evidence of DVT __________________________________________  PROCEDURES  Procedure(s) performed: None  Critical Care performed: None  ____________________________________________   ED COURSE / ASSESSMENT AND PLAN  Pertinent labs & imaging results that were available during my care of the patient were reviewed by me and considered in my medical decision making (see chart for details).   Patient's here with a significant right lower extremity cellulitis. I did order an ultrasound to rule out underlying DVT and this was negative.  In terms of the infection, it is extensive enough that I'm concerned that she needs IV antibiotics after multiple days of outpatient therapy which she has failed.    CONSULTATIONS:  Hospitalist for admission,Dr. Gouru.   Patient / Family / Caregiver informed of clinical course, medical decision-making process, and agree with plan.    ___________________________________________   FINAL CLINICAL IMPRESSION(S) / ED DIAGNOSES   Final diagnoses:  Right leg swelling  Cellulitis of right leg              Note: This dictation was prepared with Dragon dictation. Any transcriptional errors that result from this process are unintentional   Governor Rooks, MD 02/08/16 1526

## 2016-02-08 NOTE — ED Notes (Signed)
[facility] notified at this time of patient admission. Spoke with Archie Pattenonya, med tech on duty.

## 2016-02-08 NOTE — Progress Notes (Addendum)
Pharmacy Antibiotic Note  Veronica Jackson is a 82 y.o. female admitted on 02/08/2016 with cellulitis.  Pharmacy has been consulted for vancomycin and Zosyn dosing. Patient received one dose of clindamycin.   Ke= 0.029 h-1 Vd= 40.7 L  t1/2= 23.9 hrs Plan: Vancomycin 1000 mg iv once then vancomycin 750 IV every 24 hours with stacked dosing and a trough with the 5th total dose. Will target goal trough 15-20 mcg/mL until r/o abscess/osteo. Zosyn 3.375 g iv once then Zosyn 3.375g IV q8h (4 hour infusion).  Height: 4\' 11"  (149.9 cm) Weight: 170 lb (77.111 kg) IBW/kg (Calculated) : 43.2  Temp (24hrs), Avg:98.9 F (37.2 C), Min:98.9 F (37.2 C), Max:98.9 F (37.2 C)   Recent Labs Lab 02/08/16 1210  WBC 7.0  CREATININE 1.19*    Estimated Creatinine Clearance: 29.3 mL/min (by C-G formula based on Cr of 1.19).    Allergies  Allergen Reactions  . Bactrim [Sulfamethoxazole-Trimethoprim]   . Rhinocort [Budesonide]     Antimicrobials this admission: clindamycin 5/11 >> x 1 vancomyicn 5/11 >>  Zosyn 5/11 >>  Dose adjustments this admission:   Microbiology results:  Thank you for allowing pharmacy to be a part of this patient's care.  Luisa HartChristy, Shivaun Bilello D 02/08/2016 5:52 PM

## 2016-02-08 NOTE — ED Notes (Signed)
Pt presents to ED via [facility] [place]. Per EMS pt was seen by a provider (unsure if nurse or MD) on Monday for cellulitis of her R  Leg and pt began having tx on Monday. Pt was started on Keflex on Tuesday per MAR from facility. Pt ambulatory at this time. NAD noted at this time. Pt alert and oriented x4.

## 2016-02-09 LAB — BASIC METABOLIC PANEL
Anion gap: 8 (ref 5–15)
BUN: 22 mg/dL — ABNORMAL HIGH (ref 6–20)
CALCIUM: 8.7 mg/dL — AB (ref 8.9–10.3)
CO2: 27 mmol/L (ref 22–32)
CREATININE: 1.23 mg/dL — AB (ref 0.44–1.00)
Chloride: 106 mmol/L (ref 101–111)
GFR calc non Af Amer: 38 mL/min — ABNORMAL LOW (ref >60)
GFR, EST AFRICAN AMERICAN: 44 mL/min — AB (ref >60)
GLUCOSE: 90 mg/dL (ref 65–99)
Potassium: 4.1 mmol/L (ref 3.5–5.1)
Sodium: 141 mmol/L (ref 135–145)

## 2016-02-09 LAB — CBC
HCT: 30 % — ABNORMAL LOW (ref 35.0–47.0)
Hemoglobin: 10 g/dL — ABNORMAL LOW (ref 12.0–16.0)
MCH: 29.6 pg (ref 26.0–34.0)
MCHC: 33.2 g/dL (ref 32.0–36.0)
MCV: 89.2 fL (ref 80.0–100.0)
PLATELETS: 232 10*3/uL (ref 150–440)
RBC: 3.36 MIL/uL — AB (ref 3.80–5.20)
RDW: 12.5 % (ref 11.5–14.5)
WBC: 5.9 10*3/uL (ref 3.6–11.0)

## 2016-02-09 NOTE — Progress Notes (Addendum)
Pharmacy Antibiotic Note  Veronica Jackson is a 82 y.o. female admitted on 02/08/2016 with cellulitis.  Pharmacy has been consulted for vancomycin and Zosyn dosing. Patient received one dose of clindamycin.   Ke= 0.028 h-1 Vd= 40  L  T1/2=24.7 hrs Plan: Continue Vancomycin 750 mg IV q24h. Will target goal trough 15-20 mcg/mL until r/o abscess/osteo.  Will order SCr for follow up in AM to assess need for dosing adjustments.  Continue Zosyn 3.375 gm IV q8h per EI protocol.  Recommendation made to MD on rounds to consider discontinuing Zosyn if not felt that GNR/anaerobic coverage is required. MD will decide once he has seen patient today.   Height: 4\' 11"  (149.9 cm) Weight: 170 lb (77.111 kg) IBW/kg (Calculated) : 43.2  Temp (24hrs), Avg:98.5 F (36.9 C), Min:98.2 F (36.8 C), Max:98.9 F (37.2 C)   Recent Labs Lab 02/08/16 1210 02/09/16 0443  WBC 7.0 5.9  CREATININE 1.19* 1.23*    Estimated Creatinine Clearance: 28.3 mL/min (by C-G formula based on Cr of 1.23).    Allergies  Allergen Reactions  . Bactrim [Sulfamethoxazole-Trimethoprim]   . Rhinocort [Budesonide]     Antimicrobials this admission: clindamycin 5/11 >> x 1 vancomyicn 5/11 >>  Zosyn 5/11 >>   Microbiology results: 5/11: MRSA PCR: negative  Neil Brickell G 02/09/2016 8:54 AM

## 2016-02-09 NOTE — Clinical Social Work Note (Signed)
Clinical Social Work Assessment  Patient Details  Name: Veronica Jackson MRN: [id number] Date of Birth: 03/07/1926  Date of referral:  02/09/16               Reason for consult:  Facility Placement, Other (Comment Required) (from [facility] )                Permission sought to share information with:  Facility Industrial/product designerContact Representative Permission granted to share information::  Yes, Verbal Permission Granted  Name::      [facility]   Agency::     Relationship::     Contact Information:     Housing/Transportation Living arrangements for the past 2 months:  Assisted Living Facility Source of Information:  Guardian, Facility Patient Interpreter Needed:  None Criminal Activity/Legal Involvement Pertinent to Current Situation/Hospitalization:  No - Comment as needed Significant Relationships:  Other(Comment), Friend ([facility] staff/ guardian ) Lives with:  Facility Resident Do you feel safe going back to the place where you live?    Need for family participation in patient care:  Yes (Comment)  Care giving concerns:  Patient is a resident at [facility] (fax: [phone]). Patient's guardian is Hilbert OdorVanda Thomas through [employer]ians[employer] [phone].    Social Worker assessment / plan:  Visual merchandiserClinical Social Worker (CSW) received consult that patient is from [facility]. CSW contacted patient's guardian Rebecka ApleyVanda. Per guardian patient is a private pay resident at [facility] and has been there for over 1 year now. Per guardian patient is very pleasant and ambulates without an assistive device. 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 Per guardian she prefers for patient to return to [facility] and requested for D/C Summary to be faxed to her at [phone]). CSW contacted ChiropractorBonnie administrator at [facility]. Per Kendal HymenBonnie [facility] when medically [facility].  FL2 complete. CSW will continue to follow  and assist as needed.   Employment status:  Disabled (Comment on whether or not currently receiving Disability), Retired Health and safety inspectornsurance information:  Medicare PT Recommendations:  Not assessed at this time Information / Referral to community resources:  Other (Comment Required) (Patient will return to [facility] )  Patient/Family's Response to care:  Patient's guardian is agreeable for patient to return to [facility].   Patient/Family's Understanding of and Emotional Response to Diagnosis, Current Treatment, and Prognosis:  Guardian was pleasant and spoke fondly of patient.   Emotional Assessment Appearance:  Appears stated age Attitude/Demeanor/Rapport:  Unable to Assess Affect (typically observed):  Unable to Assess Orientation:  Oriented to Self, Oriented to Place, Fluctuating Orientation (Suspected and/or reported Sundowners) Alcohol / Substance use:  Not Applicable Psych involvement (Current and /or in the community):  No (Comment)  Discharge Needs  Concerns to be addressed:  Discharge Planning Concerns Readmission within the last 30 days:  No Current discharge risk:  Chronically ill, Cognitively Impaired Barriers to Discharge:  Continued Medical Work up   Haig ProphetMorgan, Chantee Cerino G, LCSW 02/09/2016, 2:42 PM

## 2016-02-09 NOTE — NC FL2 (Signed)
[place] MEDICAID FL2 LEVEL OF CARE SCREENING TOOL     IDENTIFICATION  Patient Name: Veronica Jackson Birthdate: [birth year] Sex: female Admission Date (Current Location): 02/08/2016  [facility] and [place] Number:  Chiropodist and Address:  [facility], [address] [place], [place] [address]      Provider Number: [id number]  Attending Physician Name and Address:  Altamese Dilling, MD  Relative Name and Phone Number:       Current Level of Care: Hospital Recommended Level of Care: Assisted Living Facility, Memory Care Prior Approval Number:    Date Approved/Denied:   PASRR Number:    Discharge Plan: Domiciliary (Rest home)    Current Diagnoses: Primary: Dementia  Patient Active Problem List   Diagnosis Date Noted  . Cellulitis of right leg 02/08/2016    Orientation RESPIRATION BLADDER Height & Weight     Self, Place  Normal Continent Weight: 170 lb (77.111 kg) Height:   (149.9 cm)  BEHAVIORAL SYMPTOMS/MOOD NEUROLOGICAL BOWEL NUTRITION STATUS   (none )  (none ) Continent Diet (Diet: Heart Healthy )  AMBULATORY STATUS COMMUNICATION OF NEEDS Skin   Independent Verbally Normal                       Personal Care Assistance Level of Assistance  Bathing, Feeding, Dressing Bathing Assistance: Independent Feeding assistance: Independent Dressing Assistance: Independent     Functional Limitations Info  Sight, Hearing, Speech Sight Info: Adequate Hearing Info: Impaired Speech Info: Adequate    SPECIAL CARE FACTORS FREQUENCY  PT (By licensed PT), OT (By licensed OT)     PT Frequency:  (5) OT Frequency:  (5)            Contractures      Additional Factors Info  Code Status, Allergies Code Status Info:  (Full Code. ) Allergies Info:  (Bactrim, Rhinocort)           Current Medications (02/09/2016):  This is the current hospital active medication list Current Facility-Administered Medications   Medication Dose Route Frequency Provider Last Rate Last Dose  . acetaminophen (TYLENOL) tablet 650 mg  650 mg Oral Q6H PRN Shaune Pollack, MD       Or  . acetaminophen (TYLENOL) suppository 650 mg  650 mg Rectal Q6H PRN Shaune Pollack, MD      . albuterol (PROVENTIL) (2.5 MG/3ML) 0.083% nebulizer solution 2.5 mg  2.5 mg Nebulization Q2H PRN Shaune Pollack, MD      . antiseptic oral rinse (BIOTENE) solution 10 mL  10 mL Mouth Rinse BID Shaune Pollack, MD   10 mL at 02/08/16 2106  . aspirin chewable tablet 81 mg  81 mg Oral Daily Shaune Pollack, MD   81 mg at 02/09/16 0935  . atenolol (TENORMIN) tablet 25 mg  25 mg Oral Daily Shaune Pollack, MD   25 mg at 02/09/16 0934  . donepezil (ARICEPT) tablet 10 mg  10 mg Oral Daily Shaune Pollack, MD   10 mg at 02/09/16 0935  . enoxaparin (LOVENOX) injection 30 mg  30 mg Subcutaneous Q24H Shaune Pollack, MD   30 mg at 02/08/16 2116  . [START ON 02/10/2016] furosemide (LASIX) tablet 40 mg  40 mg Oral QODAY Shaune Pollack, MD      . guaifenesin (ROBITUSSIN) 100 MG/5ML syrup 400 mg  400 mg Oral Q6H PRN Shaune Pollack, MD      . isosorbide mononitrate (IMDUR) 24 hr tablet 120 mg  120 mg  Oral Daily Shaune PollackQing Chen, MD   120 mg at 02/09/16 0935  . loratadine (CLARITIN) tablet 5 mg  5 mg Oral Daily Shaune PollackQing Chen, MD   5 mg at 02/09/16 0935  . magnesium hydroxide (MILK OF MAGNESIA) suspension 30 mL  30 mL Oral Daily PRN Shaune PollackQing Chen, MD      . memantine [facility](NAMENDA) tablet 5 mg  5 mg Oral BID Shaune PollackQing Chen, MD   5 mg at 02/09/16 0934  . montelukast (SINGULAIR) tablet 10 mg  10 mg Oral QHS Shaune PollackQing Chen, MD   10 mg at 02/08/16 2107  . morphine 2 MG/ML injection 2 mg  2 mg Intravenous Q4H PRN Shaune PollackQing Chen, MD      . nitroGLYCERIN (NITROSTAT) SL tablet 0.4 mg  0.4 mg Sublingual Q5 min PRN Shaune PollackQing Chen, MD      . ondansetron [facility](ZOFRAN) tablet 4 mg  4 mg Oral Q6H PRN Shaune PollackQing Chen, MD       Or  . ondansetron [facility](ZOFRAN) injection 4 mg  4 mg Intravenous Q6H PRN Shaune PollackQing Chen, MD      . oxyCODONE-acetaminophen (PERCOCET/ROXICET) 5-325 MG per tablet 1 tablet  1  tablet Oral Q4H PRN Shaune PollackQing Chen, MD   1 tablet at 02/09/16 0940  . piperacillin-tazobactam (ZOSYN) IVPB 3.375 g  3.375 g Intravenous Q8H Shaune PollackQing Chen, MD   3.375 g at 02/09/16 1353  . pravastatin (PRAVACHOL) tablet 20 mg  20 mg Oral q1800 Shaune PollackQing Chen, MD      . senna [facility](SENOKOT) tablet 8.6 mg  1 tablet Oral QHS Shaune PollackQing Chen, MD   8.6 mg at 02/08/16 2107  . vancomycin (VANCOCIN) IVPB 1000 mg/200 mL premix  1,000 mg Intravenous STAT Shaune PollackQing Chen, MD      . vancomycin (VANCOCIN) IVPB 750 mg/150 ml premix  750 mg Intravenous Q24H Shaune PollackQing Chen, MD   750 mg at 02/09/16 1347     Discharge Medications: Please see discharge summary for a list of discharge medications.  Relevant Imaging Results:  Relevant Lab Results:   Additional Information  (SSN: [id number])  Haig ProphetMorgan, Bailey G, LCSW

## 2016-02-09 NOTE — Progress Notes (Signed)
[facility] [place] [facility]   PATIENT NAME: Veronica Jackson    MR#:  [id number]  DATE OF BIRTH:  12/29/1925  SUBJECTIVE:  CHIEF COMPLAINT:   Chief Complaint  Patient presents with  . Leg Pain      Came with swelling and redness on right leg, not getting better with oral meds as out pt.  redness is better today comapred to skin marking done in ER.  REVIEW OF SYSTEMS:  CONSTITUTIONAL: No fever, fatigue or weakness.  EYES: No blurred or double vision.  EARS, NOSE, AND THROAT: No tinnitus or ear pain.  RESPIRATORY: No cough, shortness of breath, wheezing or hemoptysis.  CARDIOVASCULAR: No chest pain, orthopnea, edema.  GASTROINTESTINAL: No nausea, vomiting, diarrhea or abdominal pain.  GENITOURINARY: No dysuria, hematuria.  ENDOCRINE: No polyuria, nocturia,  HEMATOLOGY: No anemia, easy bruising or bleeding SKIN: No rash or lesion. MUSCULOSKELETAL: No joint pain or arthritis.   NEUROLOGIC: No tingling, numbness, weakness.  PSYCHIATRY: No anxiety or depression.   ROS  DRUG ALLERGIES:   Allergies  Allergen Reactions  . Bactrim [Sulfamethoxazole-Trimethoprim]   . Rhinocort [Budesonide]     VITALS:  Blood pressure 121/62, pulse 65, temperature 98.3 F (36.8 C), temperature source Oral, resp. rate 20, height 4\' 11"  (1.499 m), weight 77.111 kg (170 lb), SpO2 97 %.  PHYSICAL EXAMINATION:  GENERAL:  82 y.o.-year-old patient lying in the bed with no acute distress.  EYES: Pupils equal, round, reactive to light and accommodation. No scleral icterus. Extraocular muscles intact.  HEENT: Head atraumatic, normocephalic. Oropharynx and nasopharynx clear.  NECK:  Supple, no jugular venous distention. No thyroid enlargement, no tenderness.  LUNGS: Normal breath sounds bilaterally, no wheezing, rales,rhonchi or crepitation. No use of accessory muscles of respiration.  CARDIOVASCULAR: S1, S2 normal. No murmurs, rubs, or gallops.  ABDOMEN: Soft, nontender, nondistended.  Bowel sounds present. No organomegaly or mass.  EXTREMITIES: positive pedal edema, cyanosis, or clubbing. Redness on right side lower leg. NEUROLOGIC: Cranial nerves II through XII are intact. Muscle strength 5/5 in all extremities. Sensation intact. Gait not checked.  PSYCHIATRIC: The patient is alert and oriented x 3.  SKIN: No obvious rash, lesion, or ulcer.   Physical Exam LABORATORY PANEL:   CBC  Recent Labs Lab 02/09/16 0443  WBC 5.9  HGB 10.0*  HCT 30.0*  PLT 232   ------------------------------------------------------------------------------------------------------------------  Chemistries   Recent Labs Lab 02/09/16 0443  NA 141  K 4.1  CL 106  CO2 27  GLUCOSE 90  BUN 22*  CREATININE 1.23*  CALCIUM 8.7*   ------------------------------------------------------------------------------------------------------------------  Cardiac Enzymes No results for input(s): TROPONINI in the last 168 hours. ------------------------------------------------------------------------------------------------------------------  RADIOLOGY:  [place] Venous Img Lower Unilateral Right  02/08/2016  CLINICAL DATA:  Right leg pain and swelling for 1 month the EXAM: Right LOWER EXTREMITY VENOUS DOPPLER ULTRASOUND TECHNIQUE: Fulco-scale sonography with graded compression, as well as color Doppler and duplex ultrasound were performed to evaluate the lower extremity deep venous systems from the level of the common femoral vein and including the common femoral, femoral, profunda femoral, popliteal and calf veins including the posterior tibial, peroneal and gastrocnemius veins when visible. The superficial great saphenous vein was also interrogated. Spectral Doppler was utilized to evaluate flow at rest and with distal augmentation maneuvers in the common femoral, femoral and popliteal veins. COMPARISON:  None. FINDINGS: Contralateral Common Femoral Vein: Respiratory phasicity is normal and symmetric with  the symptomatic side. No evidence of thrombus. Normal compressibility. Common Femoral Vein: No  evidence of thrombus. Normal compressibility, respiratory phasicity and response to augmentation. Saphenofemoral Junction: No evidence of thrombus. Normal compressibility and flow on color Doppler imaging. Profunda Femoral Vein: No evidence of thrombus. Normal compressibility and flow on color Doppler imaging. Femoral Vein: No evidence of thrombus. Normal compressibility, respiratory phasicity and response to augmentation. Popliteal Vein: No evidence of thrombus. Normal compressibility, respiratory phasicity and response to augmentation. Calf Veins: Peroneal vein is not visualized. Posterior tibial vein shows no thrombus. Superficial Great Saphenous Vein: No evidence of thrombus. Normal compressibility and flow on color Doppler imaging. Venous Reflux:  None. Other Findings:  None. IMPRESSION: No evidence of deep venous thrombosis. Electronically Signed   By: Alcide Clever M.D.   On: 02/08/2016 13:22    ASSESSMENT AND PLAN:   Principal Problem:   Cellulitis of right leg  * Cellulitis    Failed oral as out pt    IV vanc+ zosyn.    May switch tomorrow to oral, MRSA screen negative.  * ASCVD   Cont home meds.  All the records are reviewed and case discussed with Care Management/Social Workerr. Management plans discussed with the patient, family and they are in agreement.  CODE STATUS: full.  TOTAL TIME TAKING CARE OF THIS PATIENT: 35 minutes.   She is independent in day to day life, currently can not walk due to swelling and pain in leg, need to ambulate with nurse before discharge.  POSSIBLE D/C IN 1-2 DAYS, DEPENDING ON CLINICAL CONDITION.   Altamese Dilling M.D on 02/09/2016   Between 7am to 6pm - Pager - [phone]  After 6pm go to [email or web address] - password [email or web address]  [facility]  Office  [phone]  CC: Primary care physician; Brandy Hale, MD  Note: This  dictation was prepared with Dragon dictation along with smaller phrase technology. Any transcriptional errors that result from this process are unintentional.

## 2016-02-10 ENCOUNTER — Inpatient Hospital Stay: Payer: Medicare Other

## 2016-02-10 LAB — CREATININE, SERUM
CREATININE: 1.21 mg/dL — AB (ref 0.44–1.00)
GFR calc Af Amer: 45 mL/min — ABNORMAL LOW (ref >60)
GFR, EST NON AFRICAN AMERICAN: 39 mL/min — AB (ref >60)

## 2016-02-10 LAB — URIC ACID: URIC ACID, SERUM: 5.7 mg/dL (ref 2.3–6.6)

## 2016-02-10 MED ORDER — TRAMADOL-ACETAMINOPHEN 37.5-325 MG PO TABS: 1.0000 | ORAL_TABLET | ORAL | Status: DC | PRN

## 2016-02-10 NOTE — Progress Notes (Signed)
[facility] [place] [facility]   PATIENT NAME: Veronica DomWillie Jackson    MR#:  [id number]  DATE OF BIRTH:  11/03/1925  SUBJECTIVE:  CHIEF COMPLAINT:   Chief Complaint  Patient presents with  . Leg Pain  Patient is a 82 year old African-American female with past medical history significant for history of schizophrenia, dementia, coronary artery disease, who presents to the hospital with complaints of right lower extremity redness and swelling. Patient was treated on Bactrim, Keflex, with no significant improvement, she was admitted to the hospital for further evaluation and treatment and was initiated on broad-spectrum antibiotic therapy. She feels that her redness as well as swelling. He is improving, however, still complains of significant pain in the lower leg, including ankle, she is concerned that she may not be able to walk due to pain. Uric acid level was checked which was found to be normal. Patient denies any injuries. Doppler ultrasound of right lower extremity did not show DVT   Review of Systems  Constitutional: Negative for fever, chills and weight loss.  HENT: Negative for congestion.   Eyes: Negative for blurred vision and double vision.  Respiratory: Negative for cough, sputum production, shortness of breath and wheezing.   Cardiovascular: Negative for chest pain, palpitations, orthopnea, leg swelling and PND.  Gastrointestinal: Negative for nausea, vomiting, abdominal pain, diarrhea, constipation and blood in stool.  Genitourinary: Negative for dysuria, urgency, frequency and hematuria.  Musculoskeletal: Positive for joint pain. Negative for falls.  Skin: Positive for rash.  Neurological: Negative for dizziness, tremors, focal weakness and headaches.  Endo/Heme/Allergies: Does not bruise/bleed easily.  Psychiatric/Behavioral: Negative for depression. The patient does not have insomnia.     VITAL SIGNS: Blood pressure 124/59, pulse 67, temperature 98.5  F (36.9 C), temperature source Oral, resp. rate 18, height 4\' 11"  (1.499 m), weight 77.111 kg (170 lb), SpO2 96 %.  PHYSICAL EXAMINATION:   GENERAL:  82 y.o.-year-old patient lying in the bed in mild to moderate emotional  distress, Fearful that she may not be able to walk, lose her right leg.  EYES: Pupils equal, round, reactive to light and accommodation. No scleral icterus. Extraocular muscles intact.  HEENT: Head atraumatic, normocephalic. Oropharynx and nasopharynx clear.  NECK:  Supple, no jugular venous distention. No thyroid enlargement, no tenderness.  LUNGS: Normal breath sounds bilaterally, no wheezing, rales,rhonchi or crepitation. No use of accessory muscles of respiration.  CARDIOVASCULAR: S1, S2 normal. No murmurs, rubs, or gallops.  ABDOMEN: Soft, nontender, nondistended. Bowel sounds present. No organomegaly or mass.  EXTREMITIES:  distal right leg. Skin is erythematous, very tender to palpation around ankle and just above ankle, swollen, range of motion is normal to slightly decreased, passive range of motion is adequate. Trace pedal edema,no  cyanosis, or clubbing.  NEUROLOGIC: Cranial nerves II through XII are intact. Muscle strength 5/5 in all extremities. Sensation intact. Gait not checked.  PSYCHIATRIC: The patient is alert and oriented x 3.  SKIN: No obvious rash, lesion, or ulcer.   ORDERS/RESULTS REVIEWED:   CBC  Recent Labs Lab 02/08/16 1210 02/09/16 0443  WBC 7.0 5.9  HGB 11.2* 10.0*  HCT 34.9* 30.0*  PLT 267 232  MCV 90.8 89.2  MCH 29.2 29.6  MCHC 32.2 33.2  RDW 12.6 12.5  LYMPHSABS 1.5  --   MONOABS 0.9  --   EOSABS 0.1  --   BASOSABS 0.0  --    ------------------------------------------------------------------------------------------------------------------  Chemistries   Recent Labs Lab 02/08/16 1210 02/09/16 0443 02/10/16  0525  NA 141 141  --   K 4.0 4.1  --   CL 105 106  --   CO2 30 27  --   GLUCOSE 79 90  --   BUN 19 22*  --    CREATININE 1.19* 1.23* 1.21*  CALCIUM 9.1 8.7*  --    ------------------------------------------------------------------------------------------------------------------ estimated creatinine clearance is 28.8 mL/min (by C-G formula based on Cr of 1.21). ------------------------------------------------------------------------------------------------------------------ No results for input(s): TSH, T4TOTAL, T3FREE, THYROIDAB in the last 72 hours.  Invalid input(s): FREET3  Cardiac Enzymes No results for input(s): CKMB, TROPONINI, MYOGLOBIN in the last 168 hours.  Invalid input(s): CK ------------------------------------------------------------------------------------------------------------------ Invalid input(s): POCBNP ---------------------------------------------------------------------------------------------------------------  RADIOLOGY: No results found.  EKG:  Orders placed or performed in visit on 06/10/14  . EKG 12-Lead  . EKG 12-Lead    ASSESSMENT AND PLAN:  Principal Problem:   Cellulitis of right leg #1. Right lower extremity cellulitis, continue antibiotic therapy, cellulitis is improving, change antibiotics to clindamycin orally upon discharge home, no blood cultures are taken, MRSA PCR is negative. No  DVT on ultrasound of right lower extremity #2. Right ankle pain, uric acid level is normal, get an x-ray of right ankle to rule out fracture, no DVT on ultrasound of right lower extremity. Add  tramadol as needed, follow closely clinically. Getting physical therapist involved for further recommendations #3. Renal insufficiency, chronic, stage III, stable #4. Anemia, follow with therapy, no active bleeding noted  Management plans discussed with the patient, family and they are in agreement.   DRUG ALLERGIES:  Allergies  Allergen Reactions  . Bactrim [Sulfamethoxazole-Trimethoprim]   . Rhinocort [Budesonide]     CODE STATUS:     Code Status Orders         Start     Ordered   02/08/16 1832  Full code   Continuous     02/08/16 1831    Code Status History    Date Active Date Inactive Code Status Order ID Comments User Context   This patient has a current code status but no historical code status.      TOTAL TIME TAKING CARE OF THIS PATIENT: 40  minutes.  Emotional support was provided  [facility] M.D on 02/10/2016 at 1:28 PM  Between 7am to 6pm - Pager - [phone]  After 6pm go to [email or web address] - password [email or web address]  [facility] [place] [facility]  Office  [phone]  CC: Primary care physician; Brandy Hale, MD

## 2016-02-10 NOTE — Evaluation (Signed)
Physical Therapy Evaluation Patient Details Name: Veronica Jackson MRN: [id number] DOB: 02/28/1926 Today's Date: 02/10/2016   History of Present Illness  Pt has been having R LE swelling that became bad enough to come to the hospital.  (-) for DVT, admitted with cellulitis.   Clinical Impression  Pt's swelling has gone down significantly since getting to the hospital.  She still has some swelling and reports sore pain, but not the intense throbbing she was having.  She still did hesitate to take full WBing through the R and had a mild limp with ambulation but she was able to go more than 100 ft w/o AD and had no safety issues.  Pt will benefit from PT in hospital to remain active and work on normalizing gait, but likely will not need further PT upon discharge.     Follow Up Recommendations No PT follow up    Equipment Recommendations       Recommendations for Other Services       Precautions / Restrictions Restrictions Weight Bearing Restrictions: No      Mobility  Bed Mobility Overal bed mobility: Independent                Transfers Overall transfer level: Independent Equipment used: None             General transfer comment: Pt able to rise to standing w/o assist and w/o safety concerns  Ambulation/Gait Ambulation/Gait assistance: Supervision Ambulation Distance (Feet): 125 Feet Assistive device: None       General Gait Details: Pt does have slight limp/favoring of the R LE but has no LOBs or safety concerns.  She is able to maintain consistent pace, though she does c/o some L hip pain from using it more to avoid WBing on R.  Stairs            Wheelchair Mobility    Modified Rankin (Stroke Patients Only)       Balance                                             Pertinent Vitals/Pain Pain Assessment: 0-10 Pain Score: 4  (reports R LE can be a 10/10 at times)    Home Living Family/patient expects to be discharged to::  Assisted living               Home Equipment:  (has cane if she needs it, doesn't typically use it)      Prior Function Level of Independence: Independent         Comments: Pt apparently walks around [facility] with no issues.     Hand Dominance        Extremity/Trunk Assessment   Upper Extremity Assessment: Overall WFL for tasks assessed (age appropriate deficits)           Lower Extremity Assessment: Overall WFL for tasks assessed (age appropriate deficits)         Communication   Communication: No difficulties  Cognition Arousal/Alertness: Awake/alert Behavior During Therapy: WFL for tasks assessed/performed Overall Cognitive Status: Within Functional Limits for tasks assessed                      General Comments      Exercises        Assessment/Plan    PT Assessment Patient needs continued PT services  PT Diagnosis Difficulty walking   PT Problem List Decreased strength;Decreased activity tolerance;Pain;Decreased safety awareness;Decreased coordination;Decreased balance;Decreased mobility  PT Treatment Interventions Gait training;Functional mobility training;Therapeutic activities;Therapeutic exercise;Balance training   PT Goals (Current goals can be found in the Care Plan section) Acute Rehab PT Goals Patient Stated Goal: go back home PT Goal Formulation: With patient Time For Goal Achievement: 02/24/16 Potential to Achieve Goals: Fair    Frequency Min 2X/week   Barriers to discharge        Co-evaluation               End of Session Equipment Utilized During Treatment: Gait belt Activity Tolerance: Patient tolerated treatment well Patient left: with chair alarm set;with call bell/phone within reach           Time: 1351-1415 PT Time Calculation (min) (ACUTE ONLY): 24 min   Charges:   PT Evaluation $PT Eval Low Complexity: 1 Procedure     PT G CodesMalachi Pro, DPT 02/10/2016, 3:44 PM

## 2016-02-11 DIAGNOSIS — D649 Anemia, unspecified: Secondary | ICD-10-CM

## 2016-02-11 DIAGNOSIS — N183 Chronic kidney disease, stage 3 unspecified: Secondary | ICD-10-CM

## 2016-02-11 DIAGNOSIS — M25571 Pain in right ankle and joints of right foot: Secondary | ICD-10-CM

## 2016-02-11 DIAGNOSIS — I2510 Atherosclerotic heart disease of native coronary artery without angina pectoris: Secondary | ICD-10-CM

## 2016-02-11 DIAGNOSIS — F0390 Unspecified dementia without behavioral disturbance: Secondary | ICD-10-CM

## 2016-02-11 DIAGNOSIS — F209 Schizophrenia, unspecified: Secondary | ICD-10-CM

## 2016-02-11 MED ORDER — OXYCODONE-ACETAMINOPHEN 5-325 MG PO TABS: 1.0000 | ORAL_TABLET | ORAL | Status: DC | PRN

## 2016-02-11 MED ORDER — CLINDAMYCIN HCL 300 MG PO CAPS: 300.0000 mg | ORAL_CAPSULE | Freq: Three times a day (TID) | ORAL | Status: DC

## 2016-02-11 NOTE — Discharge Summary (Signed)
[facility] [place] [facility]   PATIENT NAME: Veronica Jackson    MR#:  [id number]  DATE OF BIRTH:  1925/12/10  DATE OF ADMISSION:  02/08/2016 ADMITTING PHYSICIAN: Veronica Pollack, MD  DATE OF DISCHARGE: No discharge date for patient encounter.  PRIMARY CARE PHYSICIAN: Brandy Hale, MD     ADMISSION DIAGNOSIS:  Cellulitis of right leg [L03.115] Right leg swelling [M79.89]  DISCHARGE DIAGNOSIS:  Principal Problem:   Cellulitis of right leg Active Problems:   Pain, joint, ankle, right   Dementia   CKD (chronic kidney disease) stage 3, GFR 30-59 ml/min   Anemia   Schizophrenia (HCC)   CAD (coronary artery disease)   SECONDARY DIAGNOSIS:   Past Medical History  Diagnosis Date  . Dementia   . Schizophrenia (HCC)   . Atherosclerotic cardiovascular disease     .pro HOSPITAL COURSE:   The patient is a 82 year old African-American female with medical history significant for history of dementia, schizophrenia, atherosclerotic cardiovascular disease who presents to the hospital with complaints of right lower extremity redness, swelling, pain for a few days prior to coming to the hospital. Patient was treated with Bactrim, Keflex, but did not improve. She was sent to emergency room for further evaluation and was admitted to the hospital. Patient's right lower extremity Doppler ultrasound did not show DVT. Patient was admitted on broad-spectrum antibiotic therapy and her redness, swelling subsided, however, patient still complained of right lower extremity pain, so she had an x-ray of her ankle, which showed no fractures. Uric acid level was also found to be normal, consistent with no gout. Patient was seen by physical therapist and was able to ambulate more than 100 feet, no physical therapy follow-up was recommended by physical therapist. It was felt that patient is stable to be discharged to assisted living facility today. Discussion by problem: #1. Right  lower extremity cellulitis, continue antibiotic therapy with clindamycin for 7 more days to complete 10 day course, redness and swelling, pain is improving,  MRSA PCR was negative. No DVT on ultrasound of right lower extremity.  #2. Right ankle pain, uric acid level was normal,  x-ray of right ankle showed no fracture, no DVT on ultrasound of right lower extremity. Physical therapist saw patient in consultation and no physical therapy follow-up was recommended. Oxycodone was added for pain control as needed.  #3. Renal insufficiency, chronic, stage III, stable #4. Anemia, follow as outpatient, Hemoccult of stool was ordered, however, not received while patient was in the hospital, no active bleeding noted.   DISCHARGE CONDITIONS:   Stable  CONSULTS OBTAINED:     DRUG ALLERGIES:   Allergies  Allergen Reactions  . Bactrim [Sulfamethoxazole-Trimethoprim]   . Rhinocort [Budesonide]     DISCHARGE MEDICATIONS:   Current Discharge Medication List    START taking these medications   Details  clindamycin (CLEOCIN) 300 MG capsule Take 1 capsule (300 mg total) by mouth 3 (three) times daily. Qty: 21 capsule, Refills: 0    oxyCODONE-acetaminophen (PERCOCET/ROXICET) 5-325 MG tablet Take 1 tablet by mouth every 4 (four) hours as needed for moderate pain. Qty: 30 tablet, Refills: 0      CONTINUE these medications which have NOT CHANGED   Details  acetaminophen (TYLENOL) 325 MG tablet Take 650 mg by mouth every 8 (eight) hours as needed for mild pain, moderate pain or fever.    antiseptic oral rinse (BIOTENE) LIQD 10 mLs by Mouth Rinse route 2 (two) times daily. Swish and spit  aspirin 81 MG chewable tablet Chew 81 mg by mouth daily.    atenolol (TENORMIN) 25 MG tablet Take 25 mg by mouth daily.    cephALEXin (KEFLEX) 500 MG capsule Take 500 mg by mouth 4 (four) times daily. For 7 days    donepezil (ARICEPT) 10 MG tablet Take 10 mg by mouth daily.    furosemide (LASIX) 20 MG tablet  Take 40 mg by mouth every other day.    guaifenesin (ROBITUSSIN) 100 MG/5ML syrup Take 400 mg by mouth every 6 (six) hours as needed for cough.    Homeopathic Products (ARNICARE ARNICA) CREA Apply topically 2 (two) times daily as needed. To right lower leg for pain    isosorbide mononitrate (IMDUR) 60 MG 24 hr tablet Take 120 mg by mouth daily.    loratadine (CLARITIN) 10 MG tablet Take 5 mg by mouth daily.    lovastatin (MEVACOR) 20 MG tablet Take 20 mg by mouth daily with supper.    magnesium hydroxide (MILK OF MAGNESIA) 400 MG/5ML suspension Take 30 mLs by mouth daily as needed for mild constipation or moderate constipation.    memantine (NAMENDA) 5 MG tablet Take 5 mg by mouth 2 (two) times daily.    Menthol (HALLS COUGH DROPS) 9.1 MG LOZG Use as directed 1 lozenge in the mouth or throat 4 (four) times daily as needed. For cough or sore throat    montelukast (SINGULAIR) 10 MG tablet Take 10 mg by mouth at bedtime.    nitroGLYCERIN (NITROSTAT) 0.4 MG SL tablet Place 0.4 mg under the tongue every 5 (five) minutes as needed for chest pain.    senna (SENOKOT) 8.6 MG TABS tablet Take 1 tablet by mouth at bedtime.         DISCHARGE INSTRUCTIONS:    Patient is to follow-up with primary care physician as outpatient  If you experience worsening of your admission symptoms, develop shortness of breath, life threatening emergency, suicidal or homicidal thoughts you must seek medical attention immediately by calling 911 or calling your MD immediately  if symptoms less severe.  You Must read complete instructions/literature along with all the possible adverse reactions/side effects for all the Medicines you take and that have been prescribed to you. Take any new Medicines after you have completely understood and accept all the possible adverse reactions/side effects.   Please note  You were cared for by a hospitalist during your hospital stay. If you have any questions about your  discharge medications or the care you received while you were in the hospital after you are discharged, you can call the unit and asked to speak with the hospitalist on call if the hospitalist that took care of you is not available. Once you are discharged, your primary care physician will handle any further medical issues. Please note that NO REFILLS for any discharge medications will be authorized once you are discharged, as it is imperative that you return to your primary care physician (or establish a relationship with a primary care physician if you do not have one) for your aftercare needs so that they can reassess your need for medications and monitor your lab values.    Today   CHIEF COMPLAINT:   Chief Complaint  Patient presents with  . Leg Pain    HISTORY OF PRESENT ILLNESS:  Anjelika Ausburn  is a 82 y.o. female with a known history of dementia, schizophrenia, atherosclerotic cardiovascular disease who presents to the hospital with complaints of right lower extremity redness, swelling,  pain for a few days prior to coming to the hospital. Patient was treated with Bactrim, Keflex, but did not improve. She was sent to emergency room for further evaluation and was admitted to the hospital. Patient's right lower extremity Doppler ultrasound did not show DVT. Patient was admitted on broad-spectrum antibiotic therapy and her redness, swelling subsided, however, patient still complained of right lower extremity pain, so she had an x-ray of her ankle, which showed no fractures. Uric acid level was also found to be normal, consistent with no gout. Patient was seen by physical therapist and was able to ambulate more than 100 feet, no physical therapy follow-up was recommended by physical therapist. It was felt that patient is stable to be discharged to assisted living facility today. Discussion by problem: #1. Right lower extremity cellulitis, continue antibiotic therapy with clindamycin for 7 more days  to complete 10 day course, redness and swelling, pain is improving,  MRSA PCR was negative. No DVT on ultrasound of right lower extremity.  #2. Right ankle pain, uric acid level was normal,  x-ray of right ankle showed no fracture, no DVT on ultrasound of right lower extremity. Physical therapist saw patient in consultation and no physical therapy follow-up was recommended. Oxycodone was added for pain control as needed.  #3. Renal insufficiency, chronic, stage III, stable #4. Anemia, follow as outpatient, Hemoccult of stool was ordered, however, not received while patient was in the hospital, no active bleeding noted.     VITAL SIGNS:  Blood pressure 131/78, pulse 54, temperature 98.1 F (36.7 C), temperature source Oral, resp. rate 16, height 4\' 11"  (1.499 m), weight 77.111 kg (170 lb), SpO2 96 %.  I/O:   Intake/Output Summary (Last 24 hours) at 02/11/16 0851 Last data filed at 02/11/16 0626  Gross per 24 hour  Intake    270 ml  Output      0 ml  Net    270 ml    PHYSICAL EXAMINATION:  GENERAL:  82 y.o.-year-old patient lying in the bed with no acute distress.  EYES: Pupils equal, round, reactive to light and accommodation. No scleral icterus. Extraocular muscles intact.  HEENT: Head atraumatic, normocephalic. Oropharynx and nasopharynx clear.  NECK:  Supple, no jugular venous distention. No thyroid enlargement, no tenderness.  LUNGS: Normal breath sounds bilaterally, no wheezing, rales,rhonchi or crepitation. No use of accessory muscles of respiration.  CARDIOVASCULAR: S1, S2 normal. No murmurs, rubs, or gallops.  ABDOMEN: Soft, non-tender, non-distended. Bowel sounds present. No organomegaly or mass.  EXTREMITIES: 1+ lower one third of right calf and pedal edema, no cyanosis, or clubbing.  NEUROLOGIC: Cranial nerves II through XII are intact. Muscle strength 5/5 in all extremities. Sensation intact. Gait not checked.  PSYCHIATRIC: The patient is alert and oriented x 3.  SKIN: No  obvious rash, lesion, or ulcer.   DATA REVIEW:   CBC  Recent Labs Lab 02/09/16 0443  WBC 5.9  HGB 10.0*  HCT 30.0*  PLT 232    Chemistries   Recent Labs Lab 02/09/16 0443 02/10/16 0525  NA 141  --   K 4.1  --   CL 106  --   CO2 27  --   GLUCOSE 90  --   BUN 22*  --   CREATININE 1.23* 1.21*  CALCIUM 8.7*  --     Cardiac Enzymes No results for input(s): TROPONINI in the last 168 hours.  Microbiology Results  Results for orders placed or performed during the hospital encounter of 02/08/16  MRSA PCR Screening     Status: None   Collection Time: 02/08/16  6:33 PM  Result Value Ref Range Status   MRSA by PCR NEGATIVE NEGATIVE Final    Comment:        The GeneXpert MRSA Assay (FDA approved for NASAL specimens only), is one component of a comprehensive MRSA colonization surveillance program. It is not intended to diagnose MRSA infection nor to guide or monitor treatment for MRSA infections.     RADIOLOGY:  Dg Ankle Complete Right  02/10/2016  CLINICAL DATA:  Onset swelling but pt states it is getting worse. Walking has more difficult. Best images obtained. EXAM: RIGHT ANKLE - COMPLETE 3+ VIEW COMPARISON:  None. FINDINGS: There is diffuse soft tissue swelling. No acute fracture or subluxation. No radiopaque foreign body or soft tissue gas. Small plantar and Achilles spurs are present. IMPRESSION: Diffuse soft tissue swelling. Electronically Signed   By: Norva Pavlov M.D.   On: 02/10/2016 17:02    EKG:   Orders placed or performed in visit on 06/10/14  . EKG 12-Lead  . EKG 12-Lead      Management plans discussed with the patient, family and they are in agreement.  CODE STATUS:     Code Status Orders        Start     Ordered   02/08/16 1832  Full code   Continuous     02/08/16 1831    Code Status History    Date Active Date Inactive Code Status Order ID Comments User Context   This patient has a current code status but no historical code  status.      TOTAL TIME TAKING CARE OF THIS PATIENT: 40 minutes.    Katharina Caper M.D on 02/11/2016 at 8:51 AM  Between 7am to 6pm - Pager - [phone]  After 6pm go to [email or web address] - password [email or web address] [facility]  [facility]  Office  [phone]  CC: Primary care physician; Brandy Hale, MD

## 2016-02-11 NOTE — Progress Notes (Signed)
Patient is medically stable for D/C back to [facility] today. Clinical Child psychotherapistocial Worker (CSW) contacted [facility] and spoke with staff member Marchelle Folksmanda. Per [name] patient can return today via EMS. CS[facility]C Summary[facility] FL[facility] and made her aware of above. CSW left voicemail for patient's guardian [name]. Patient is aware of above. RN will arrange EMS for transport. Please reconsult if future social work needs arise. CSW signing off.   [name], LCSW [phone]

## 2016-02-11 NOTE — Progress Notes (Signed)
Patient is to be discharged home today back to [facility]. Patient is in no acute distress at this time, and assessment is unchanged from this morning. Patient's IV is out, discharge paperwork has been discussed with patient/family and there are no questions or concerns at this time. Patient will be accompanied downstairs by EMS.

## 2016-02-11 NOTE — [place] FL2 (Signed)
[place] MEDICAID FL2 LEVEL OF CARE SCREENING TOOL     IDENTIFICATION  Patient Name: Veronica DitchWillie L Quinby Birthdate: 11/04/1925 Sex: female Admission Date (Current Location): 02/08/2016  [facility] [place]   Facility and Address:  [facility], [address], [place], [place] [address]      Provider Number: [id number]  Attending Physician Name and Address:  Katharina Caperima Vaickute, MD  Relative Name and Phone Number:       Current Level of Care: Hospital Recommended Level of Care: Assisted Living Facility, Memory Care Prior Approval Number:    Date Approved/Denied:   PASRR Number:    Discharge Plan: Domiciliary (Rest home)    Current Diagnoses: Primary: Dementia  Patient Active Problem List   Diagnosis Date Noted  . Pain, joint, ankle, right 02/11/2016  . CKD (chronic kidney disease) stage 3, GFR 30-59 ml/min 02/11/2016  . Anemia 02/11/2016  . Schizophrenia (HCC) 02/11/2016  . Dementia 02/11/2016  . CAD (coronary artery disease) 02/11/2016  . Cellulitis of right leg 02/08/2016    Orientation RESPIRATION BLADDER Height & Weight     Self, Place  Normal Continent Weight: 170 lb (77.111 kg) Height:  4\' 11"  (149.9 cm)  BEHAVIORAL SYMPTOMS/MOOD NEUROLOGICAL BOWEL NUTRITION STATUS   (none )  (none ) Continent Diet (Diet: Heart Healthy )  AMBULATORY STATUS COMMUNICATION OF NEEDS Skin   Independent Verbally Normal                       Personal Care Assistance Level of Assistance  Bathing, Feeding, Dressing Bathing Assistance: Independent Feeding assistance: Independent Dressing Assistance: Independent     Functional Limitations Info  Sight, Hearing, Speech Sight Info: Adequate Hearing Info: Impaired Speech Info: Adequate    SPECIAL CARE FACTORS FREQUENCY                    Contractures      Additional Factors Info  Code Status, Allergies Code Status Info:  (Full Code. ) Allergies Info:  (Bactrim, Rhinocort)           Discharge Medications: Please see discharge summary for a list of discharge medications. Current Discharge Medication List    START taking these medications   Details  clindamycin (CLEOCIN) 300 MG capsule Take 1 capsule (300 mg total) by mouth 3 (three) times daily. Qty: 21 capsule, Refills: 0    oxyCODONE-acetaminophen (PERCOCET/ROXICET) 5-325 MG tablet Take 1 tablet by mouth every 4 (four) hours as needed for moderate pain. Qty: 30 tablet, Refills: 0      CONTINUE these medications which have NOT CHANGED   Details  acetaminophen (TYLENOL) 325 MG tablet Take 650 mg by mouth every 8 (eight) hours as needed for mild pain, moderate pain or fever.    antiseptic oral rinse (BIOTENE) LIQD 10 mLs by Mouth Rinse route 2 (two) times daily. Swish and spit    aspirin 81 MG chewable tablet Chew 81 mg by mouth daily.    atenolol (TENORMIN) 25 MG tablet Take 25 mg by mouth daily.    cephALEXin (KEFLEX) 500 MG capsule Take 500 mg by mouth 4 (four) times daily. For 7 days    donepezil (ARICEPT) 10 MG tablet Take 10 mg by mouth daily.    furosemide (LASIX) 20 MG tablet Take 40 mg by mouth every other day.    guaifenesin (ROBITUSSIN) 100 MG/5ML syrup Take 400 mg by mouth every 6 (six) hours as needed for cough.  Homeopathic Products (ARNICARE ARNICA) CREA Apply topically 2 (two) times daily as needed. To right lower leg for pain    isosorbide mononitrate (IMDUR) 60 MG 24 hr tablet Take 120 mg by mouth daily.    loratadine (CLARITIN) 10 MG tablet Take 5 mg by mouth daily.    lovastatin (MEVACOR) 20 MG tablet Take 20 mg by mouth daily with supper.    magnesium hydroxide (MILK OF MAGNESIA) 400 MG/5ML suspension Take 30 mLs by mouth daily as needed for mild constipation or moderate constipation.    memantine (NAMENDA) 5 MG tablet Take 5 mg by mouth 2 (two) times daily.    Menthol (HALLS COUGH DROPS) 9.1 MG LOZG Use as  directed 1 lozenge in the mouth or throat 4 (four) times daily as needed. For cough or sore throat    montelukast (SINGULAIR) 10 MG tablet Take 10 mg by mouth at bedtime.    nitroGLYCERIN (NITROSTAT) 0.4 MG SL tablet Place 0.4 mg under the tongue every 5 (five) minutes as needed for chest pain.    senna (SENOKOT) 8.6 MG TABS tablet Take 1 tablet by mouth at bedtime.             Relevant Imaging Results:  Relevant Lab Results:   Additional Information  (SSN: [id number])  Haig Prophet, LCSW

## 2016-02-12 NOTE — Progress Notes (Signed)
Clinical Child psychotherapistocial Worker (CSW) received call from patient's guardian [place] requesting D/C Summary to be faxed to her. CSW sent guardian requested information.   [name], LCSW [phone]

## 2016-08-30 ENCOUNTER — Ambulatory Visit (INDEPENDENT_AMBULATORY_CARE_PROVIDER_SITE_OTHER): Payer: Medicare Other | Admitting: Vascular Surgery

## 2016-08-30 ENCOUNTER — Encounter (INDEPENDENT_AMBULATORY_CARE_PROVIDER_SITE_OTHER): Payer: Self-pay | Admitting: Vascular Surgery

## 2016-08-30 VITALS — BP 159/78 | HR 63 | Resp 18 | Ht 59.0 in | Wt 171.0 lb

## 2016-08-30 DIAGNOSIS — M7989 Other specified soft tissue disorders: Secondary | ICD-10-CM

## 2016-08-30 DIAGNOSIS — F0390 Unspecified dementia without behavioral disturbance: Secondary | ICD-10-CM | POA: Diagnosis not present

## 2016-08-30 DIAGNOSIS — N183 Chronic kidney disease, stage 3 unspecified: Secondary | ICD-10-CM

## 2016-08-30 NOTE — Assessment & Plan Note (Signed)
Her swelling is improved. She has been avoiding salt and elevating her legs as well as wearing support stockings. She should continue these adjuvant therapies. I do not think she needs a lymphedema pump at this point, as her symptoms are better. Return to clinic as needed.

## 2016-08-30 NOTE — Progress Notes (Signed)
MRN : [id number]  Veronica Jackson is a 82 y.o. (06/04/1926) female who presents with chief complaint of  Chief Complaint  Patient presents with  . Re-evaluation    3 month follow up  .  History of Present Illness: Patient returns today in follow up of Her leg swelling. She reports this to be significantly better. Part of this may be the cooling of the weather and part has been lifestyle modifications with avoidance of salt, elevating her legs, and wearing support stockings. She does not have ulceration or infection.  Current Outpatient Prescriptions  Medication Sig Dispense Refill  . acetaminophen (TYLENOL) 325 MG tablet Take 650 mg by mouth every 8 (eight) hours as needed for mild pain, moderate pain or fever.    Marland Kitchen. antiseptic oral rinse (BIOTENE) LIQD 10 mLs by Mouth Rinse route 2 (two) times daily. Swish and spit    . aspirin 81 MG chewable tablet Chew 81 mg by mouth daily.    Marland Kitchen. atenolol (TENORMIN) 25 MG tablet Take 25 mg by mouth daily.    Marland Kitchen. azelastine (ASTELIN) 0.1 % nasal spray Place into the nose.    . clindamycin (CLEOCIN) 300 MG capsule Take 1 capsule (300 mg total) by mouth 3 (three) times daily. 21 capsule 0  . donepezil (ARICEPT) 10 MG tablet Take 10 mg by mouth daily.    . furosemide (LASIX) 20 MG tablet Take 40 mg by mouth every other day.    . guaifenesin (ROBITUSSIN) 100 MG/5ML syrup Take 400 mg by mouth every 6 (six) hours as needed for cough.    . Homeopathic Products (ARNICARE ARNICA) CREA Apply topically 2 (two) times daily as needed. To right lower leg for pain    . isosorbide mononitrate (IMDUR) 60 MG 24 hr tablet Take 120 mg by mouth daily.    Marland Kitchen. loratadine (CLARITIN) 10 MG tablet Take 5 mg by mouth daily.    Marland Kitchen. lovastatin (MEVACOR) 20 MG tablet Take 20 mg by mouth daily with supper.    . magnesium hydroxide (MILK OF MAGNESIA) 400 MG/5ML suspension Take 30 mLs by mouth daily as needed for mild constipation or moderate constipation.    . memantine (NAMENDA) 5 MG tablet  Take 5 mg by mouth 2 (two) times daily.    . Menthol (HALLS COUGH DROPS) 9.1 MG LOZG Use as directed 1 lozenge in the mouth or throat 4 (four) times daily as needed. For cough or sore throat    . montelukast (SINGULAIR) 10 MG tablet Take 10 mg by mouth at bedtime.    . nitroGLYCERIN (NITROSTAT) 0.4 MG SL tablet Place 0.4 mg under the tongue every 5 (five) minutes as needed for chest pain.    Marland Kitchen. omega-3 acid ethyl esters (LOVAZA) 1 g capsule Take by mouth.    . oxyCODONE-acetaminophen (PERCOCET/ROXICET) 5-325 MG tablet Take 1 tablet by mouth every 4 (four) hours as needed for moderate pain. 30 tablet 0  . senna (SENOKOT) 8.6 MG TABS tablet Take 1 tablet by mouth at bedtime.     No current facility-administered medications for this visit.     Past Medical History:  Diagnosis Date  . Atherosclerotic cardiovascular disease   . Dementia   . Schizophrenia [facility](HCC)     Past Surgical History:  Procedure Laterality Date  . ABDOMINAL HYSTERECTOMY      Social History Social History  Substance Use Topics  . Smoking status: Never Smoker  . Smokeless tobacco: Never Used  . Alcohol use No  Lives in a facility   Family History Family History  Problem Relation Age of Onset  . Heart attack Mother      Allergies  Allergen Reactions  . Bactrim [Sulfamethoxazole-Trimethoprim]   . Erythromycin Itching  . Penicillins Swelling  . Rhinocort [Budesonide] Nausea And Vomiting  . Sulfa Antibiotics Nausea Only  . Tetracyclines & Related Itching     REVIEW OF SYSTEMS (Negative unless checked)  Constitutional: [] Weight loss  [] Fever  [] Chills Cardiac: [] Chest pain   [] Chest pressure   [] Palpitations   [] Shortness of breath when laying flat   [] Shortness of breath at rest   [] Shortness of breath with exertion. Vascular:  [] Pain in legs with walking   [x] Pain in legs at rest   [] Pain in legs when laying flat   [] Claudication   [] Pain in feet when walking  [] Pain in feet at rest  [] Pain in feet when  laying flat   [] History of DVT   [] Phlebitis   [x] Swelling in legs   [] Varicose veins   [] Non-healing ulcers Pulmonary:   [] Uses home oxygen   [] Productive cough   [] Hemoptysis   [] Wheeze  [] COPD   [] Asthma Neurologic:  [] Dizziness  [] Blackouts   [] Seizures   [] History of stroke   [] History of TIA  [] Aphasia   [] Temporary blindness   [] Dysphagia   [] Weakness or numbness in arms   [] Weakness or numbness in legs Musculoskeletal:  [] Arthritis   [] Joint swelling   [] Joint pain   [] Low back pain Hematologic:  [] Easy bruising  [] Easy bleeding   [] Hypercoagulable state   [] Anemic   Gastrointestinal:  [] Blood in stool   [] Vomiting blood  [] Gastroesophageal reflux/heartburn   [] Abdominal pain Genitourinary:  [] Chronic kidney disease   [] Difficult urination  [] Frequent urination  [] Burning with urination   [] Hematuria Skin:  [] Rashes   [] Ulcers   [] Wounds Psychological:  [] History of anxiety   []  History of major depression.  Physical Examination  BP (!) 159/78 (BP Location: Right Arm)   Pulse 63   Resp 18   Ht 4\' 11"  (1.499 m)   Wt 77.6 kg (171 lb)   BMI 34.54 kg/m  Gen:  WD/WN, NAD. Appears younger than stated age Head: Delta/AT, No temporalis wasting. Ear/Nose/Throat: Hearing grossly intact, nares w/o erythema or drainage, trachea midline Eyes: Conjunctiva clear. Sclera non-icteric Neck: Supple.  No JVD.  Pulmonary:  Good air movement, no use of accessory muscles.  Cardiac: RRR, normal S1, S2 Vascular:  Vessel Right Left  Radial Palpable Palpable                                   Gastrointestinal: soft, non-tender/non-distended. No guarding/reflex.  Musculoskeletal: M/S 5/5 throughout.  No deformity or atrophy. 1+ bilateral lower extremity edema. Neurologic: Sensation grossly intact in extremities.  Symmetrical.  Speech is fluent.  Psychiatric: Judgment intact, Mood & affect appropriate for pt's clinical situation. Dermatologic: No rashes or ulcers noted.  No cellulitis or open  wounds. Lymph : No Cervical, Axillary, or Inguinal lymphadenopathy.      Labs No results found for this or any previous visit (from the past 2160 hour(s)).  Radiology No results found.    Assessment/Plan  CKD (chronic kidney disease) stage 3, GFR 30-59 ml/min Could certainly be contributing to her leg swelling.  Dementia Lives in a facility  Swelling of limb Her swelling is improved. She has been avoiding salt and elevating her legs as well  as wearing support stockings. She should continue these adjuvant therapies. I do not think she needs a lymphedema pump at this point, as her symptoms are better. Return to clinic as needed.    Festus Barren, MD  08/30/2016 12:00 PM    This note was created with Dragon medical transcription system.  Any errors from dictation are purely unintentional

## 2016-08-30 NOTE — Assessment & Plan Note (Signed)
Could certainly be contributing to her leg swelling.

## 2016-08-30 NOTE — Assessment & Plan Note (Signed)
Lives in a facility

## 2018-05-16 ENCOUNTER — Emergency Department: Payer: Medicare Other

## 2018-05-16 ENCOUNTER — Other Ambulatory Visit: Payer: Self-pay

## 2018-05-16 ENCOUNTER — Emergency Department
Admission: EM | Admit: 2018-05-16 | Discharge: 2018-05-16 | Disposition: A | Discharge status: Skilled Nursing Facility | Payer: Medicare Other | Attending: Emergency Medicine | Admitting: Emergency Medicine

## 2018-05-16 DIAGNOSIS — R079 Chest pain, unspecified: Secondary | ICD-10-CM | POA: Insufficient documentation

## 2018-05-16 DIAGNOSIS — N183 Chronic kidney disease, stage 3 (moderate): Secondary | ICD-10-CM | POA: Diagnosis not present

## 2018-05-16 DIAGNOSIS — F0390 Unspecified dementia without behavioral disturbance: Secondary | ICD-10-CM | POA: Insufficient documentation

## 2018-05-16 DIAGNOSIS — I2510 Atherosclerotic heart disease of native coronary artery without angina pectoris: Secondary | ICD-10-CM | POA: Diagnosis not present

## 2018-05-16 LAB — CBC
HCT: 33.5 % — ABNORMAL LOW (ref 35.0–47.0)
HEMOGLOBIN: 11 g/dL — AB (ref 12.0–16.0)
MCH: 30.4 pg (ref 26.0–34.0)
MCHC: 32.8 g/dL (ref 32.0–36.0)
MCV: 92.7 fL (ref 80.0–100.0)
PLATELETS: 231 10*3/uL (ref 150–440)
RBC: 3.62 MIL/uL — ABNORMAL LOW (ref 3.80–5.20)
RDW: 13.5 % (ref 11.5–14.5)
WBC: 5.7 10*3/uL (ref 3.6–11.0)

## 2018-05-16 LAB — COMPREHENSIVE METABOLIC PANEL
ALT: 10 U/L (ref 0–44)
ANION GAP: 9 (ref 5–15)
AST: 22 U/L (ref 15–41)
Albumin: 3.7 g/dL (ref 3.5–5.0)
Alkaline Phosphatase: 66 U/L (ref 38–126)
BUN: 16 mg/dL (ref 8–23)
CHLORIDE: 107 mmol/L (ref 98–111)
CO2: 27 mmol/L (ref 22–32)
CREATININE: 1.25 mg/dL — AB (ref 0.44–1.00)
Calcium: 8.6 mg/dL — ABNORMAL LOW (ref 8.9–10.3)
GFR, EST AFRICAN AMERICAN: 43 mL/min — AB (ref >60)
GFR, EST NON AFRICAN AMERICAN: 37 mL/min — AB (ref >60)
Glucose, Bld: 89 mg/dL (ref 70–99)
POTASSIUM: 4.5 mmol/L (ref 3.5–5.1)
SODIUM: 143 mmol/L (ref 135–145)
Total Bilirubin: 0.7 mg/dL (ref 0.3–1.2)
Total Protein: 7 g/dL (ref 6.5–8.1)

## 2018-05-16 LAB — TROPONIN I

## 2018-05-16 MED ORDER — NITROGLYCERIN 0.4 MG SL SUBL: 0.4000 mg | SUBLINGUAL_TABLET | Freq: Once | SUBLINGUAL | Status: DC

## 2018-05-16 NOTE — ED Notes (Signed)
[facility]

## 2018-05-16 NOTE — ED Notes (Signed)
Two IV attempts by this nurse. Unsuccessful in gaining access at this time.

## 2018-05-16 NOTE — ED Triage Notes (Signed)
Pt from memory care unit at [facility]. Sent out for chest pain. AOx4 with medic. Pt reporting CP x2 days intermittent. Pt reports feeling stuffy. 324 ASA given with medic.

## 2018-05-16 NOTE — ED Notes (Signed)
Pt unable to sign discharge paperwork. [facility]. Papers will be sent back to facility with EMS.

## 2018-05-16 NOTE — ED Provider Notes (Signed)
[facility] Emergency Department Provider Note       Time seen: ----------------------------------------- 8:18 AM on 05/16/2018 -----------------------------------------   I have reviewed the triage vital signs and the nursing notes.  HISTORY   Chief Complaint Chest Pain    HPI Veronica Jackson is a 84 y.o. female with a history of cardiovascular disease, dementia, schizophrenia who presents to the ED for chest pain.  Patient was from the memory care unit at a [facility] facility [facility].  She was sent out for chest pain.  She reports chest pain intermittently for the past 2 days.  She does have as needed nitroglycerin written for but was not given any.  She was given aspirin by a paramedic.  She denies fevers, chills or other complaints.  Past Medical History:  Diagnosis Date  . Atherosclerotic cardiovascular disease   . Dementia   . Schizophrenia [facility](HCC)     Patient Active Problem List   Diagnosis Date Noted  . Swelling of limb 08/30/2016  . Pain, joint, ankle, right 02/11/2016  . CKD (chronic kidney disease) stage 3, GFR 30-59 ml/min (HCC) 02/11/2016  . Anemia 02/11/2016  . Schizophrenia (HCC) 02/11/2016  . Dementia 02/11/2016  . CAD (coronary artery disease) 02/11/2016  . Cellulitis of right leg 02/08/2016    Past Surgical History:  Procedure Laterality Date  . ABDOMINAL HYSTERECTOMY      Allergies Bactrim [sulfamethoxazole-trimethoprim]; Erythromycin; Penicillins; Rhinocort [budesonide]; Sulfa antibiotics; and Tetracyclines & related  Social History Social History   Tobacco Use  . Smoking status: Never Smoker  . Smokeless tobacco: Never Used  Substance Use Topics  . Alcohol use: No  . Drug use: No   Review of Systems Constitutional: Negative for fever. Cardiovascular: Positive for chest pain Respiratory: Negative for shortness of breath. Gastrointestinal: Negative for abdominal pain, vomiting and  diarrhea. Musculoskeletal: Negative for back pain. Skin: Negative for rash. Neurological: Negative for headaches, focal weakness or numbness.  All systems negative/normal/unremarkable except as stated in the HPI  ____________________________________________   PHYSICAL EXAM:  VITAL SIGNS: ED Triage Vitals  Enc Vitals Group     BP 05/16/18 0812 (!) 145/61     Pulse Rate 05/16/18 0809 66     Resp 05/16/18 0809 18     Temp 05/16/18 0809 98.6 F (37 C)     Temp Source 05/16/18 0809 Oral     SpO2 05/16/18 0809 98 %     Weight --      Height 05/16/18 0809 4\' 11"  (1.499 m)     Head Circumference --      Peak Flow --      Pain Score 05/16/18 0808 0     Pain Loc --      Pain Edu? --      Excl. in GC? --    Constitutional: Alert and oriented. Well appearing and in no distress. Eyes: Conjunctivae are normal. Normal extraocular movements. ENT   Head: Normocephalic and atraumatic.   Nose: No congestion/rhinnorhea.   Mouth/Throat: Mucous membranes are moist.   Neck: No stridor. Cardiovascular: Normal rate, regular rhythm. No murmurs, rubs, or gallops. Respiratory: Normal respiratory effort without tachypnea nor retractions. Breath sounds are clear and equal bilaterally. No wheezes/rales/rhonchi. Gastrointestinal: Soft and nontender. Normal bowel sounds Musculoskeletal: Nontender with normal range of motion in extremities. No lower extremity tenderness nor edema. Neurologic:  Normal speech and language. No gross focal neurologic deficits are appreciated.  Skin:  Skin is warm, dry and intact. No  rash noted. Psychiatric: Mood and affect are normal. Speech and behavior are normal.  ____________________________________________  EKG: Interpreted by me.  Sinus rhythm rate 62 bpm, PVC, likely septal infarct age-indeterminate, normal QT  ____________________________________________  ED COURSE:  As part of my medical decision making, I reviewed the following data within the  electronic MEDICAL RECORD NUMBER History obtained from family if available, nursing notes, old chart and ekg, as well as notes from prior ED visits. Patient presented for chest pain, we will assess with labs and imaging as indicated at this time. Clinical Course as of May 16 938  Sat May 16, 2018  [id number] Patient has no chest pain at this time   [JW]    Clinical Course User Index [JW] Emily FilbertWilliams, Hermenegildo Clausen E, MD   Procedures ____________________________________________   LABS (pertinent positives/negatives)  Labs Reviewed  CBC - Abnormal; Notable for the following components:      Result Value   RBC 3.62 (*)    Hemoglobin 11.0 (*)    HCT 33.5 (*)    All other components within normal limits  COMPREHENSIVE METABOLIC PANEL - Abnormal; Notable for the following components:   Creatinine, Ser 1.25 (*)    Calcium 8.6 (*)    GFR calc non Af Amer 37 (*)    GFR calc Af Amer 43 (*)    All other components within normal limits  TROPONIN I    RADIOLOGY Images were viewed by me  Chest x-ray Is unremarkable ____________________________________________  DIFFERENTIAL DIAGNOSIS   Unstable angina, PE, MI, pneumothorax, musculoskeletal pain, GERD, anxiety  FINAL ASSESSMENT AND PLAN  Chest pain   Plan: The patient had presented for chest pain. Patient's labs did not reveal any acute process. Patient's imaging was also negative.  She is not having any further symptoms here.  She is on Imdur and is supposed to receive intermittent Nitrostat as needed, however this was not performed at her residence.  Overall she appears cleared for outpatient cardiology follow-up.   Ulice DashJohnathan E Yoan Sallade, MD   Note: This note was generated in part or whole with voice recognition software. Voice recognition is usually quite accurate but there are transcription errors that can and very often do occur. I apologize for any typographical errors that were not detected and corrected.     Emily FilbertWilliams, Lecil Tapp E,  MD 05/16/18 [phone]

## 2018-09-15 ENCOUNTER — Non-Acute Institutional Stay: Payer: Medicare Other | Admitting: Nurse Practitioner

## 2018-09-15 ENCOUNTER — Encounter: Payer: Self-pay | Admitting: Nurse Practitioner

## 2018-09-15 VITALS — HR 78 | Temp 97.0°F | Resp 18 | Ht 62.0 in | Wt 178.0 lb

## 2018-09-15 DIAGNOSIS — R413 Other amnesia: Secondary | ICD-10-CM | POA: Insufficient documentation

## 2018-09-15 DIAGNOSIS — Z515 Encounter for palliative care: Secondary | ICD-10-CM | POA: Insufficient documentation

## 2018-09-15 DIAGNOSIS — R600 Localized edema: Secondary | ICD-10-CM | POA: Insufficient documentation

## 2018-09-15 NOTE — Progress Notes (Signed)
[facility] Telephone: [phone] Fax: [phone]  PATIENT NAME: Veronica Jackson DOB: 10/31/1925 MRN: [id number]  PRIMARY CARE PROVIDER:   Cristy FolksFleming, [phone], MD  REFERRING PROVIDER:  [facility]; [facility] RESPONSIBLE PARTY:   [facility]or[facility] DSS [phone] or [phone]  ASSESSMENT:     I visited and observed Veronica Jackson. We talked about purpose for palliative medicine visit. We talked about how she was feeling. She verbalize that she's sleepy today. It's raining and cold outside. We talked about symptoms of pain and shortness of breath but she denies. We talked about her appetite and she replies that it's actually too good. She talked about having to lose weight. She talked about being 84 years old and residing in an assisted living facility. We talked about activities that she enjoys to attend. We talked about Christmas which is coming soon. She talked about going Christmas shopping. Emotional support provided. Limited verbal discussion due to cognitive impairment, difficulty with memory. She was cooperative with assessment. Attempted to contact DSS Guardian for further discussion of goals of care, code status. I updated staff.  07/17/2018 weight 184.2 lb 08/30/2018 weight 178 lb  RECOMMENDATIONS and PLAN:  1.Palliative care encounter Z51.5; Palliative medicine team will continue to support patient, patient's family, and medical team. Visit consisted of counseling and education dealing with the complex and emotionally intense issues of symptom management and palliative care in the setting of serious and potentially life-threatening illness  2. Edema R60.9 secondary to HTN, age, monitor weights, elevate BLE, compression hose if she will wear them.  3. Memory loss R41.3 appears progressive secondary to dementia. Medical goals to continue to focus on Comfort, redirecting with supportive measures.  I spent 45 minutes providing this consultation,  from 9:15am to 10:00am. More than 50%  of the time in this consultation was spent coordinating communication.   HISTORY OF PRESENT ILLNESS:  Veronica DitchWillie L Jackson is a 84 y.o. year old female with multiple medical problems including Coronary artery disease, dementia, hypertension, anemia, chronic kidney disease, schizophrenia, abdominal hysterectomy. Veronica. Wallace Jackson continues to reside in assisted living facility locked memory care unit. She is ambulatory dresses herself with prompting. She is corporative and participates in activities. She toilets herself. She feeds herself and appetite has been Fair depending on what's being served. She has had a weight loss since last palliative medicine care visit on 8 / 6 / 2019. She's been followed by palliative medicine since 10 / 26 / 2017 for goals of care with ongoing discussion about code status. She was changed to a full code. Staff endorses no recent hospitalizations, Falls, wounds, infections. She is able to verbalize her needs. At present she is sitting in the chair in her room. She appears comfortable. No visitors present. Palliative Care was asked to help address goals of care.   CODE STATUS: full code  PPS: 50% HOSPICE ELIGIBILITY/DIAGNOSIS: TBD  PAST MEDICAL HISTORY:  Past Medical History:  Diagnosis Date  . Atherosclerotic cardiovascular disease   . Dementia (HCC)   . Schizophrenia (HCC)     SOCIAL HX:  Social History   Tobacco Use  . Smoking status: Never Smoker  . Smokeless tobacco: Never Used  Substance Use Topics  . Alcohol use: No    ALLERGIES:  Allergies  Allergen Reactions  . Bactrim [Sulfamethoxazole-Trimethoprim]   . Erythromycin Itching  . Penicillins Swelling  . Rhinocort [Budesonide] Nausea And Vomiting  . Sulfa Antibiotics Nausea Only  . Tetracyclines & Related Itching     PERTINENT  MEDICATIONS:  Outpatient Encounter Medications as of 09/15/2018  Medication Sig  . albuterol (PROVENTIL HFA;VENTOLIN HFA) 108 (90 Base) MCG/ACT inhaler Inhale 2 puffs into the lungs  every 6 (six) hours as needed for wheezing or shortness of breath.  Marland Kitchen aspirin 81 MG chewable tablet Chew 81 mg by mouth daily.  Marland Kitchen atenolol (TENORMIN) 50 MG tablet Take 50 mg by mouth daily.   Marland Kitchen donepezil (ARICEPT) 10 MG tablet Take 10 mg by mouth every evening.   . fluticasone (FLONASE) 50 MCG/ACT nasal spray Place 2 sprays into both nostrils daily as needed for allergies or rhinitis.  . furosemide (LASIX) 20 MG tablet Take 40 mg by mouth every other day.  . guaifenesin (ROBITUSSIN) 100 MG/5ML syrup Take 400 mg by mouth every 6 (six) hours as needed for cough.  . isosorbide mononitrate (IMDUR) 120 MG 24 hr tablet Take 120 mg by mouth daily.   Marland Kitchen lovastatin (MEVACOR) 20 MG tablet Take 20 mg by mouth daily with supper.  . memantine (NAMENDA) 5 MG tablet Take 10 mg by mouth every 12 (twelve) hours.   . montelukast (SINGULAIR) 10 MG tablet Take 10 mg by mouth at bedtime.  . nitroGLYCERIN (NITROSTAT) 0.4 MG SL tablet Place 0.4 mg under the tongue every 5 (five) minutes as needed for chest pain.  . polyvinyl alcohol (LIQUIFILM TEARS) 1.4 % ophthalmic solution Place 1 drop into both eyes 3 (three) times daily as needed for dry eyes.  Marland Kitchen QUEtiapine (SEROQUEL) 50 MG tablet Take 50 mg by mouth every morning.  Marland Kitchen QUEtiapine (SEROQUEL) 50 MG tablet Take 75 mg by mouth at bedtime.  . sennosides-docusate sodium (SENOKOT-S) 8.6-50 MG tablet Take 1 tablet by mouth at bedtime.   No facility-administered encounter medications on file as of 09/15/2018.     PHYSICAL EXAM:   General: NAD, obese, pleasant female Cardiovascular: regular rate and rhythm Pulmonary: clear ant fields Abdomen: soft, nontender, + bowel sounds GU: no suprapubic tenderness Extremities: no edema, no joint deformities Skin: no rashes Neurological: ambulatory, wnl   Z , NP

## 2018-09-17 ENCOUNTER — Telehealth: Payer: Self-pay | Admitting: Nurse Practitioner

## 2018-09-17 NOTE — Telephone Encounter (Signed)
[facility] DSS [phone] or [phone]; message left

## 2018-12-17 ENCOUNTER — Non-Acute Institutional Stay: Payer: Medicare Other | Admitting: Student

## 2018-12-17 ENCOUNTER — Other Ambulatory Visit: Payer: Self-pay

## 2018-12-17 VITALS — BP 140/80 | HR 68 | Resp 16

## 2018-12-17 DIAGNOSIS — Z515 Encounter for palliative care: Secondary | ICD-10-CM

## 2018-12-17 NOTE — Progress Notes (Signed)
Therapist, nutritional Palliative Care Consult Note Telephone: [phone]  Fax: [phone]  PATIENT NAME: Veronica Jackson DOB: 02-17-1926 MRN: [id number]  PRIMARY CARE PROVIDER:   Cristy Folks, MD  REFERRING PROVIDER:  [facility]  RESPONSIBLE PARTY: [place] Odor DSS [phone] or [phone]   ASSESSMENT: Veronica Jackson is sitting in her room watching television. Pleasant affect.No acute distress noted. Explained role of Palliative Medicine. She welcomes visit. Veronica Jackson talks about living in [place] and working at the hospital in the past. She also talks about moving back to [employer]. Some forgetfulness noted. We discussed symptom management. Discussed with med tech Bonita Quin. Will notify DSS worker to provide an update.     RECOMMENDATIONS and PLAN:  1. Medical goals of therapy: Palliative Medicine will monitor for changes/declines. Will make recommendations as needed. 2. Symptom management: Edema-wear support stockings daily; encouraged to elevate legs when sitting in chair or in bed. Impaired memory: staff to continue providing cues/direction as needed, monitor for cognitive changes. 3. Discharge Planning: Veronica Jackson will continue to reside at [facility] on memory unit. 5. Emotional support: Discussed with Veronica Jackson and Med tech Bonita Quin; staff is encouraged to call with questions.   Palliative Medicine will follow up in 8 weeks or sooner, if needed.  I spent 25 minutes providing this consultation,  from 11:20am to 11:45am. More than 50% of the time in this consultation was spent coordinating communication.   HISTORY OF PRESENT ILLNESS:  Veronica Jackson is a 85 y.o.  female with multiple medical problems including dementia, CAD, hypertension, anemia, chronic kidney disease, schizophrenia. Palliative Care was asked to help address goals of care; she is seen for follow up visit today. Veronica Jackson resides at [facility] on locked memory  care unit. Veronica Jackson reports feeling good today. She is able to verbalize her needs. She states last week she was having indigestion and constipation. She states both were relieved with medication. She requires assistance/cueing with adl's.  She is ambulatory; she states she walks on the unit daily after each meal for exercise. She dresses herself with prompting; toilets herself. She participates in activities. She feeds herself; currently taking meals in her room due to facility wide precautions. Good appetite reported by med [employer]. No weight loss reported. She's been followed by palliative medicine since 07/25/2016 for goals of care with ongoing discussion about code status. No new orders reported. No falls reported. No recent emergency room visits or hospitalizations. Bonita Quin states Veronica Jackson has been doing well.   CODE STATUS: Full Code  PPS: 50% HOSPICE ELIGIBILITY/DIAGNOSIS: TBD  PAST MEDICAL HISTORY:  Past Medical History:  Diagnosis Date  . Atherosclerotic cardiovascular disease   . Dementia (HCC)   . Schizophrenia (HCC)     SOCIAL HX:  Social History   Tobacco Use  . Smoking status: Never Smoker  . Smokeless tobacco: Never Used  Substance Use Topics  . Alcohol use: No    ALLERGIES:  Allergies  Allergen Reactions  . Bactrim [Sulfamethoxazole-Trimethoprim]   . Erythromycin Itching  . Penicillins Swelling  . Rhinocort [Budesonide] Nausea And Vomiting  . Sulfa Antibiotics Nausea Only  . Tetracyclines & Related Itching     PERTINENT MEDICATIONS:  Outpatient Encounter Medications as of 12/17/2018  Medication Sig  . albuterol (PROVENTIL HFA;VENTOLIN HFA) 108 (90 Base) MCG/ACT inhaler Inhale 2 puffs into the lungs every 6 (six) hours as needed for wheezing or shortness of breath.  Marland Kitchen aspirin 81 MG  chewable tablet Chew 81 mg by mouth daily.  Marland Kitchen atenolol (TENORMIN) 50 MG tablet Take 50 mg by mouth daily.   Marland Kitchen donepezil (ARICEPT) 10 MG tablet Take 10 mg by mouth every evening.   .  fluticasone (FLONASE) 50 MCG/ACT nasal spray Place 2 sprays into both nostrils daily as needed for allergies or rhinitis.  . furosemide (LASIX) 20 MG tablet Take 40 mg by mouth every other day.  . guaifenesin (ROBITUSSIN) 100 MG/5ML syrup Take 400 mg by mouth every 6 (six) hours as needed for cough.  . isosorbide mononitrate (IMDUR) 120 MG 24 hr tablet Take 120 mg by mouth daily.   Marland Kitchen lovastatin (MEVACOR) 20 MG tablet Take 20 mg by mouth daily with supper.  . memantine (NAMENDA) 5 MG tablet Take 10 mg by mouth every 12 (twelve) hours.   . montelukast (SINGULAIR) 10 MG tablet Take 10 mg by mouth at bedtime.  . nitroGLYCERIN (NITROSTAT) 0.4 MG SL tablet Place 0.4 mg under the tongue every 5 (five) minutes as needed for chest pain.  . polyvinyl alcohol (LIQUIFILM TEARS) 1.4 % ophthalmic solution Place 1 drop into both eyes 3 (three) times daily as needed for dry eyes.  Marland Kitchen QUEtiapine (SEROQUEL) 50 MG tablet Take 50 mg by mouth every morning.  Marland Kitchen QUEtiapine (SEROQUEL) 50 MG tablet Take 75 mg by mouth at bedtime.  . sennosides-docusate sodium (SENOKOT-S) 8.6-50 MG tablet Take 1 tablet by mouth at bedtime.   No facility-administered encounter medications on file as of 12/17/2018.     PHYSICAL EXAM:   General: NAD, pleasant affect, obese Cardiovascular: regular rate and rhythm Pulmonary: clear ant fields Abdomen: soft, nontender, + bowel sounds GU: no suprapubic tenderness Extremities: 1+ edema, no joint deformities Skin: no rashes Neurological: Weakness but otherwise nonfocal  Luella Cook, NP

## 2018-12-18 ENCOUNTER — Telehealth: Payer: Self-pay | Admitting: Student

## 2018-12-18 NOTE — Telephone Encounter (Signed)
Palliative NP left message to follow up on patient's visit yesterday with guardian Hilbert Odor. She is encouraged to call with questions.

## 2019-05-12 ENCOUNTER — Telehealth: Payer: Self-pay | Admitting: Student

## 2019-05-12 NOTE — Telephone Encounter (Signed)
Palliative NP spoke with facility nurse [place]. She denies patient having any needs. Offered zoom visit as facility is limiting outside visitors; she declines at this time. She is encouraged to call with any needs or concerns.

## 2019-07-22 ENCOUNTER — Emergency Department
Admission: EM | Admit: 2019-07-22 | Discharge: 2019-07-22 | Disposition: A | Discharge status: Home / Self Care | Payer: Medicare Other | Attending: Emergency Medicine | Admitting: Emergency Medicine

## 2019-07-22 ENCOUNTER — Other Ambulatory Visit: Payer: Self-pay

## 2019-07-22 ENCOUNTER — Emergency Department: Payer: Medicare Other

## 2019-07-22 ENCOUNTER — Encounter: Payer: Self-pay | Admitting: Emergency Medicine

## 2019-07-22 DIAGNOSIS — F0390 Unspecified dementia without behavioral disturbance: Secondary | ICD-10-CM | POA: Insufficient documentation

## 2019-07-22 DIAGNOSIS — Z7982 Long term (current) use of aspirin: Secondary | ICD-10-CM | POA: Diagnosis not present

## 2019-07-22 DIAGNOSIS — R4182 Altered mental status, unspecified: Secondary | ICD-10-CM | POA: Diagnosis present

## 2019-07-22 DIAGNOSIS — I2510 Atherosclerotic heart disease of native coronary artery without angina pectoris: Secondary | ICD-10-CM | POA: Insufficient documentation

## 2019-07-22 DIAGNOSIS — Z79899 Other long term (current) drug therapy: Secondary | ICD-10-CM | POA: Diagnosis not present

## 2019-07-22 DIAGNOSIS — N183 Chronic kidney disease, stage 3 unspecified: Secondary | ICD-10-CM | POA: Insufficient documentation

## 2019-07-22 LAB — CBC WITH DIFFERENTIAL/PLATELET
Abs Immature Granulocytes: 0.03 10*3/uL (ref 0.00–0.07)
Basophils Absolute: 0 10*3/uL (ref 0.0–0.1)
Basophils Relative: 0 %
Eosinophils Absolute: 0.1 10*3/uL (ref 0.0–0.5)
Eosinophils Relative: 2 %
HCT: 34.1 % — ABNORMAL LOW (ref 36.0–46.0)
Hemoglobin: 10.6 g/dL — ABNORMAL LOW (ref 12.0–15.0)
Immature Granulocytes: 1 %
Lymphocytes Relative: 20 %
Lymphs Abs: 1.3 10*3/uL (ref 0.7–4.0)
MCH: 29.4 pg (ref 26.0–34.0)
MCHC: 31.1 g/dL (ref 30.0–36.0)
MCV: 94.5 fL (ref 80.0–100.0)
Monocytes Absolute: 0.6 10*3/uL (ref 0.1–1.0)
Monocytes Relative: 10 %
Neutro Abs: 4.2 10*3/uL (ref 1.7–7.7)
Neutrophils Relative %: 67 %
Platelets: 220 10*3/uL (ref 150–400)
RBC: 3.61 MIL/uL — ABNORMAL LOW (ref 3.87–5.11)
RDW: 11.9 % (ref 11.5–15.5)
WBC: 6.3 10*3/uL (ref 4.0–10.5)
nRBC: 0 % (ref 0.0–0.2)

## 2019-07-22 LAB — URINALYSIS, COMPLETE (UACMP) WITH MICROSCOPIC
Bilirubin Urine: NEGATIVE
Glucose, UA: NEGATIVE mg/dL
Hgb urine dipstick: NEGATIVE
Ketones, ur: NEGATIVE mg/dL
Leukocytes,Ua: NEGATIVE
Nitrite: NEGATIVE
Protein, ur: NEGATIVE mg/dL
Specific Gravity, Urine: 1.013 (ref 1.005–1.030)
pH: 5 (ref 5.0–8.0)

## 2019-07-22 LAB — COMPREHENSIVE METABOLIC PANEL
ALT: 12 U/L (ref 0–44)
AST: 16 U/L (ref 15–41)
Albumin: 3.9 g/dL (ref 3.5–5.0)
Alkaline Phosphatase: 65 U/L (ref 38–126)
Anion gap: 14 (ref 5–15)
BUN: 20 mg/dL (ref 8–23)
CO2: 24 mmol/L (ref 22–32)
Calcium: 9 mg/dL (ref 8.9–10.3)
Chloride: 104 mmol/L (ref 98–111)
Creatinine, Ser: 1.35 mg/dL — ABNORMAL HIGH (ref 0.44–1.00)
GFR calc Af Amer: 40 mL/min — ABNORMAL LOW (ref >60)
GFR calc non Af Amer: 34 mL/min — ABNORMAL LOW (ref >60)
Glucose, Bld: 111 mg/dL — ABNORMAL HIGH (ref 70–99)
Potassium: 4.1 mmol/L (ref 3.5–5.1)
Sodium: 142 mmol/L (ref 135–145)
Total Bilirubin: 0.6 mg/dL (ref 0.3–1.2)
Total Protein: 7.4 g/dL (ref 6.5–8.1)

## 2019-07-22 NOTE — ED Notes (Signed)
Report off to andrea rn  

## 2019-07-22 NOTE — ED Notes (Signed)
Pt in hallway bed.  Pt alert

## 2019-07-22 NOTE — ED Notes (Addendum)
Pt discharged into care of her sister Jaquita Rector. Facility aware. Pt and sister escorted to vehicle and given instructions on care back to facility. Discharge packet for facility given to sister.

## 2019-07-22 NOTE — ED Notes (Signed)
In contact with [place]. Spoke with management and they gave permission to have pt sister transport pt back to facility.

## 2019-07-22 NOTE — ED Notes (Signed)
Report given to staff at [facility].

## 2019-07-22 NOTE — Discharge Instructions (Addendum)
CT scan was negative for acute process.  Urine was also negative for UTI.  Basic labs are also reassuring.

## 2019-07-22 NOTE — ED Notes (Signed)
Pt in hallway bed.  Pt alert.  Labs sent.

## 2019-07-22 NOTE — ED Notes (Signed)
Tried to contact Veronica Jackson [phone]  No answer

## 2019-07-22 NOTE — ED Notes (Signed)
Charge nurse Allegra Cerniglia rn spoke with sister who will come get pt.

## 2019-07-22 NOTE — ED Triage Notes (Signed)
Pt arrives via ems from the home place of [place]. EMS called because pt "escaped" dementia unit at facility. Faculty reports to ems that pt had increased ams from her baseline of dementia. Unclear of what the alterations from baseline are.

## 2019-07-22 NOTE — ED Notes (Signed)
ED Provider at bedside. 

## 2019-07-22 NOTE — ED Provider Notes (Signed)
[facility] Emergency Department Provider Note  ____________________________________________   First MD Initiated Contact with Patient 07/22/19 [phone]     (approximate)  I have reviewed the triage vital signs and the nursing notes.   HISTORY  Chief Complaint Altered Mental Status    HPI Veronica Jackson is a 85 y.o. female with dementia and schizophrenia who comes in from home Place of [place] after escaping the dementia unit.  Per factly patient has increased altered mental status from her baseline dementia.  Patient here is very pleasant and stating that she denies any complaints at all.  She is obviously has baseline dementia given she is reporting that she needs to get back to work but otherwise she is moving all extremities and denies any chest pain, shortness of breath, abdominal pain, dysuria.  History is limited due to patient's baseline dementia.          Past Medical History:  Diagnosis Date   Atherosclerotic cardiovascular disease    Dementia (HCC)    Schizophrenia [facility])     Patient Active Problem List   Diagnosis Date Noted   Palliative care encounter 09/15/2018   Localized edema 09/15/2018   Memory loss 09/15/2018   Swelling of limb 08/30/2016   Pain, joint, ankle, right 02/11/2016   CKD (chronic kidney disease) stage 3, GFR 30-59 ml/min 02/11/2016   Anemia 02/11/2016   Schizophrenia (HCC) 02/11/2016   Dementia (HCC) 02/11/2016   CAD (coronary artery disease) 02/11/2016   Cellulitis of right leg 02/08/2016    Past Surgical History:  Procedure Laterality Date   ABDOMINAL HYSTERECTOMY      Prior to Admission medications   Medication Sig Start Date End Date Taking? Authorizing Provider  albuterol (PROVENTIL HFA;VENTOLIN HFA) 108 (90 Base) MCG/ACT inhaler Inhale 2 puffs into the lungs every 6 (six) hours as needed for wheezing or shortness of breath.    [provider]  aspirin 81 MG chewable tablet Chew  81 mg by mouth daily.    [provider]  atenolol (TENORMIN) 50 MG tablet Take 50 mg by mouth daily.     [provider]  donepezil (ARICEPT) 10 MG tablet Take 10 mg by mouth every evening.     [provider]  fluticasone (FLONASE) 50 MCG/ACT nasal spray Place 2 sprays into both nostrils daily as needed for allergies or rhinitis.    [provider]  furosemide (LASIX) 20 MG tablet Take 40 mg by mouth every other day.    [provider]  guaifenesin (ROBITUSSIN) 100 MG/5ML syrup Take 400 mg by mouth every 6 (six) hours as needed for cough.    [provider]  isosorbide mononitrate (IMDUR) 120 MG 24 hr tablet Take 120 mg by mouth daily.     [provider]  lovastatin (MEVACOR) 20 MG tablet Take 20 mg by mouth daily with supper.    [provider]  memantine (NAMENDA) 5 MG tablet Take 10 mg by mouth every 12 (twelve) hours.     [provider]  montelukast (SINGULAIR) 10 MG tablet Take 10 mg by mouth at bedtime.    [provider]  nitroGLYCERIN (NITROSTAT) 0.4 MG SL tablet Place 0.4 mg under the tongue every 5 (five) minutes as needed for chest pain.    [provider]  polyvinyl alcohol (LIQUIFILM TEARS) 1.4 % ophthalmic solution Place 1 drop into both eyes 3 (three) times daily as needed for dry eyes.    [provider]  QUEtiapine (SEROQUEL) 50 MG tablet Take 50 mg by mouth every morning.    [provider]  QUEtiapine (SEROQUEL) 50 MG tablet Take 75 mg by mouth at bedtime.    [provider]  sennosides-docusate sodium (SENOKOT-S) 8.6-50 MG tablet Take 1 tablet by mouth at bedtime.    [provider]    Allergies Bactrim [sulfamethoxazole-trimethoprim], Erythromycin, Penicillins, Rhinocort [budesonide], Sulfa antibiotics, and Tetracyclines & related  Family History  Problem Relation Age of Onset   Heart attack Mother     Social History Social History     Tobacco Use   Smoking status: Never Smoker   Smokeless tobacco: Never Used  Substance Use Topics   Alcohol use: No   Drug use: No      Review of Systems Constitutional: No fever/chills Eyes: No visual changes. ENT: No sore throat. Cardiovascular: Denies chest pain. Respiratory: Denies shortness of breath. Gastrointestinal: No abdominal pain.  No nausea, no vomiting.  No diarrhea.  No constipation. Genitourinary: Negative for dysuria. Musculoskeletal: Negative for back pain. Skin: Negative for rash. Neurological: Negative for headaches, focal weakness or numbness. All other ROS negative however somewhat limited due to patient's baseline dementia. ____________________________________________   PHYSICAL EXAM:  VITAL SIGNS: ED Triage Vitals [07/22/19 1602]  Enc Vitals Group     BP 122/60     Pulse Rate 71     Resp 17     Temp 98.1 F (36.7 C)     Temp Source Oral     SpO2 97 %     Weight 178 lb 9.2 oz (81 kg)     Height 5\' 3"  (1.6 m)     Head Circumference      Peak Flow      Pain Score 0     Pain Loc      Pain Edu?      Excl. in GC?     Constitutional: Alert and oriented. Well appearing and in no acute distress. Eyes: Conjunctivae are normal. EOMI. Head: Atraumatic. Nose: No congestion/rhinnorhea. Mouth/Throat: Mucous membranes are moist.   Neck: No stridor. Trachea Midline. FROM Cardiovascular: Normal rate, regular rhythm. Grossly normal heart sounds.  Good peripheral circulation. Respiratory: Normal respiratory effort.  No retractions. Lungs CTAB. Gastrointestinal: Soft and nontender. No distention. No abdominal bruits.  Musculoskeletal: No lower extremity tenderness nor edema.  No joint effusions. Neurologic:  Normal speech and language. No gross focal neurologic deficits are appreciated.  Equal strength in her arms and legs. Skin:  Skin is warm, dry and intact. No rash noted. Psychiatric: Mood and affect are normal. Speech and behavior are  normal. GU: Deferred   ____________________________________________   LABS (all labs ordered are listed, but only abnormal results are displayed)  Labs Reviewed  CBC WITH DIFFERENTIAL/PLATELET  COMPREHENSIVE METABOLIC PANEL  URINALYSIS, COMPLETE (UACMP) WITH MICROSCOPIC   ____________________________________________  RADIOLOGY   Official radiology report(s): Ct Head Wo Contrast  Result Date: 07/22/2019 CLINICAL DATA:  Confusion EXAM: CT HEAD WITHOUT CONTRAST TECHNIQUE: Contiguous axial images were obtained from the base of the skull through the vertex without intravenous contrast. COMPARISON:  None. FINDINGS: Brain: No acute territorial infarction, hemorrhage or intracranial mass. Mild atrophy and small vessel ischemic changes of the white matter. Nonenlarged ventricles. Vascular: No hyperdense vessels. Vertebral and carotid vascular calcification Skull: Normal. Negative for fracture or focal lesion. Sinuses/Orbits: No acute finding. Other: None IMPRESSION: 1. No CT evidence for acute intracranial abnormality. 2. Mild atrophy and small vessel ischemic changes of the white matter.  Electronically Signed   By: Donavan Foil M.D.   On: 07/22/2019 16:25    ____________________________________________   PROCEDURES  Procedure(s) performed (including Critical Care):  Procedures   ____________________________________________   INITIAL IMPRESSION / ASSESSMENT AND PLAN / ED COURSE  Veronica Jackson was evaluated in Emergency Department on 07/22/2019 for the symptoms described in the history of present illness. She was evaluated in the context of the global COVID-19 pandemic, which necessitated consideration that the patient might be at risk for infection with the SARS-CoV-2 virus that causes COVID-19. Institutional protocols and algorithms that pertain to the evaluation of patients at risk for COVID-19 are in a state of rapid change based on information released by regulatory bodies  including the CDC and federal and state organizations. These policies and algorithms were followed during the patient's care in the ED.    Patient is a very pleasant 85 year old who presents after escaping to her Alzheimer's unit.  Patient denies any specific concerns at this time although her history is somewhat limited due to her baseline dementia.  Will get labs to evaluate for electrolyte abnormalities, AKI, UTI, CT head evaluate for intracranial hemorrhage given the report of her being off from her baseline although patient is moving all extremities well and interactive with staff.  4:23 PM D/w memory care unit.  Per facility hearing more voices and wanting to go home and talking about her husband.  They were unable to get UA.  They are okay with bringing pt back as long as workup is negative.  They have doctors onsite they can help adjust medications as needed.   CT head is negative for acute process  UA without evidence of UTI  White count is normal.  kidney function at baseline.   I discussed the provisional nature of ED diagnosis, the treatment so far, the ongoing plan of care, follow up appointments and return precautions with the patient and any family or support people present. They expressed understanding and agreed with the plan, discharged home.    ____________________________________________   FINAL CLINICAL IMPRESSION(S) / ED DIAGNOSES   Final diagnoses:  Dementia without behavioral disturbance, unspecified dementia type ([place])      MEDICATIONS GIVEN DURING THIS VISIT:  Medications - No data to display   ED Discharge Orders    None       Note:  This document was prepared using Dragon voice recognition software and may include unintentional dictation errors.   Vanessa Mayo, MD 07/22/19 [phone]

## 2019-09-03 ENCOUNTER — Other Ambulatory Visit: Payer: Self-pay

## 2019-09-03 ENCOUNTER — Non-Acute Institutional Stay: Payer: Medicare Other | Admitting: Adult Health Nurse Practitioner

## 2019-09-03 DIAGNOSIS — F0390 Unspecified dementia without behavioral disturbance: Secondary | ICD-10-CM

## 2019-09-03 DIAGNOSIS — Z515 Encounter for palliative care: Secondary | ICD-10-CM

## 2019-09-03 NOTE — Progress Notes (Signed)
Therapist, nutritional Palliative Care Consult Note Telephone: [phone]  Fax: [phone]  PATIENT NAME: Veronica Jackson DOB: 06/26/1926 MRN: [id number]  PRIMARY CARE PROVIDER:   Cristy Folks, MD  REFERRING PROVIDER:  [facility]  RESPONSIBLE PARTY:   [place] DSS [phone] or [phone]     RECOMMENDATIONS and PLAN:  1.  Advance care planning.  Patient is a full code.  Patient has legal guardian through DSS, Hilbert Odor.  Called and left VM with Ms. Thomas with reason for call and call back info.  2.  Dementia.  FAST 6a.  Patient is ambulatory and requires minimal assistance with ADLs.  She requires more set up with prompting and cueing for her ADLs.  Continent of B&B.  She was evaluated in ER on 07/22/2019 after eloping from memory care unit with no acute findings of concern.Today she has no new concerns.  Denies pain, SOB, cough, fever, N/V/D, constipation, edema, dizziness.  Staff have no new concerns.  Weight today is 184 pounds. No reported changes in appetite or weight. Continue supportive care.  Palliative care will continue to monitor for symptom management and decline and make recommendations as needed.     I spent 25 minutes providing this consultation,  from 10:55 to 11:20. More than 50% of the time in this consultation was spent coordinating communication.   HISTORY OF PRESENT ILLNESS:  Veronica Jackson is a 85 y.o. year old female with multiple medical problems including dementia, CAD, hypertension, anemia, chronic kidney disease, schizophrenia. Palliative Care was asked to help address goals of care.   CODE STATUS: full code  PPS: 50% HOSPICE ELIGIBILITY/DIAGNOSIS: TBD  PHYSICAL EXAM:   General: sitting in chair in NAD Cardiovascular: regular rate and rhythm Pulmonary: lung sounds clear; normal respiratory effort Abdomen: soft, nontender, + bowel sounds GU: no suprapubic tenderness Extremities: no edema, no joint deformities Skin: no  rashes Neurological: Weakness; A&O to person and place   PAST MEDICAL HISTORY:  Past Medical History:  Diagnosis Date  . Atherosclerotic cardiovascular disease   . Dementia (HCC)   . Schizophrenia (HCC)     SOCIAL HX:  Social History   Tobacco Use  . Smoking status: Never Smoker  . Smokeless tobacco: Never Used  Substance Use Topics  . Alcohol use: No    ALLERGIES:  Allergies  Allergen Reactions  . Bactrim [Sulfamethoxazole-Trimethoprim]   . Erythromycin Itching  . Penicillins Swelling  . Rhinocort [Budesonide] Nausea And Vomiting  . Sulfa Antibiotics Nausea Only  . Tetracyclines & Related Itching     PERTINENT MEDICATIONS:  Outpatient Encounter Medications as of 09/03/2019  Medication Sig  . albuterol (PROVENTIL HFA;VENTOLIN HFA) 108 (90 Base) MCG/ACT inhaler Inhale 2 puffs into the lungs every 6 (six) hours as needed for wheezing or shortness of breath.  Marland Kitchen aspirin 81 MG chewable tablet Chew 81 mg by mouth daily.  Marland Kitchen atenolol (TENORMIN) 50 MG tablet Take 50 mg by mouth daily.   Marland Kitchen donepezil (ARICEPT) 10 MG tablet Take 10 mg by mouth every evening.   . fluticasone (FLONASE) 50 MCG/ACT nasal spray Place 2 sprays into both nostrils daily as needed for allergies or rhinitis.  . furosemide (LASIX) 20 MG tablet Take 40 mg by mouth every other day.  . guaifenesin (ROBITUSSIN) 100 MG/5ML syrup Take 400 mg by mouth every 6 (six) hours as needed for cough.  . isosorbide mononitrate (IMDUR) 120 MG 24 hr tablet Take 120 mg by mouth daily.   Marland Kitchen lovastatin (MEVACOR)  20 MG tablet Take 20 mg by mouth daily with supper.  . memantine (NAMENDA) 5 MG tablet Take 10 mg by mouth every 12 (twelve) hours.   . montelukast (SINGULAIR) 10 MG tablet Take 10 mg by mouth at bedtime.  . nitroGLYCERIN (NITROSTAT) 0.4 MG SL tablet Place 0.4 mg under the tongue every 5 (five) minutes as needed for chest pain.  . polyethylene glycol (MIRALAX / GLYCOLAX) 17 g packet Take 17 g by mouth every 3 (three) days.   . polyvinyl alcohol (LIQUIFILM TEARS) 1.4 % ophthalmic solution Place 1 drop into both eyes 3 (three) times daily as needed for dry eyes.  Marland Kitchen QUEtiapine (SEROQUEL) 100 MG tablet Take 50 mg by mouth every morning.   Marland Kitchen QUEtiapine (SEROQUEL) 100 MG tablet Take 100 mg by mouth at bedtime.   . sennosides-docusate sodium (SENOKOT-S) 8.6-50 MG tablet Take 1 tablet by mouth at bedtime.   No facility-administered encounter medications on file as of 09/03/2019.      Amy Jenetta Downer, NP

## 2020-01-07 ENCOUNTER — Other Ambulatory Visit: Payer: Self-pay

## 2020-01-07 ENCOUNTER — Non-Acute Institutional Stay: Payer: Medicare Other | Admitting: Adult Health Nurse Practitioner

## 2020-01-07 DIAGNOSIS — Z515 Encounter for palliative care: Secondary | ICD-10-CM

## 2020-01-07 DIAGNOSIS — F0390 Unspecified dementia without behavioral disturbance: Secondary | ICD-10-CM

## 2020-01-07 NOTE — Progress Notes (Signed)
Therapist, nutritional Palliative Care Consult Note Telephone: [phone]  Fax: [phone]  PATIENT NAME: Veronica Jackson DOB: 1926-07-22 MRN: [id number]  PRIMARY CARE PROVIDER:   Cristy Folks, MD  REFERRING PROVIDER:  [facility]  RESPONSIBLE PARTY:   [place] [facility] [facility] [phone] or [phone]    RECOMMENDATIONS and PLAN:  1.  Advance care planning.  Patient is a full code.  Patient has legal guardian through DSS, [place] Odor.  Called and left VM with Ms. Thomas with reason for call and call back info.  2.  Dementia.  FAST 6a.  Patient is ambulatory and requires minimal assistance with ADLs.  She requires more set up with prompting and cueing for her ADLs.  Continent of B&B. Today she has no new concerns.  Denies pain, SOB, cough, fever, N/V/D, constipation, dizziness.  Staff have no new concerns. No reported changes in appetite or weight. Continue supportive care at facility.  Patient is stable with no reported falls, infection, hospital visits since last visit.  Palliative care will continue to monitor for symptom management and decline and make recommendations as needed.  Will follow up in 6-8 weeks  I spent 30 minutes providing this consultation,  from 11:40 to 12:10  Including time with patient/family, chart review, provider coordination, documentation. More than 50% of the time in this consultation was spent coordinating communication.   HISTORY OF PRESENT ILLNESS:  Veronica Jackson is a 86 y.o. year old female with multiple medical problems including dementia, CAD, hypertension,anemia, chronic kidney disease, schizophrenia.. Palliative Care was asked to help address goals of care.   CODE STATUS: full code  PPS: 50% HOSPICE ELIGIBILITY/DIAGNOSIS: TBD  PHYSICAL EXAM:  BP 138/82  HR 59  O2 98% on RA General: sitting in chair in NAD Cardiovascular: regular rate and rhythm Pulmonary: lung sounds clear; normal respiratory effort Abdomen: soft, nontender, +  bowel sounds GU: no suprapubic tenderness Extremities: trace edema to bilateral ankles, no joint deformities Skin: no rashes on exposed skon Neurological: Weakness; A&O to person and place  PAST MEDICAL HISTORY:  Past Medical History:  Diagnosis Date  . Atherosclerotic cardiovascular disease   . Dementia (HCC)   . Schizophrenia (HCC)     SOCIAL HX:  Social History   Tobacco Use  . Smoking status: Never Smoker  . Smokeless tobacco: Never Used  Substance Use Topics  . Alcohol use: No    ALLERGIES:  Allergies  Allergen Reactions  . Bactrim [Sulfamethoxazole-Trimethoprim]   . Erythromycin Itching  . Penicillins Swelling  . Rhinocort [Budesonide] Nausea And Vomiting  . Sulfa Antibiotics Nausea Only  . Tetracyclines & Related Itching     PERTINENT MEDICATIONS:  Outpatient Encounter Medications as of 01/07/2020  Medication Sig  . albuterol (PROVENTIL HFA;VENTOLIN HFA) 108 (90 Base) MCG/ACT inhaler Inhale 2 puffs into the lungs every 6 (six) hours as needed for wheezing or shortness of breath.  Marland Kitchen aspirin 81 MG chewable tablet Chew 81 mg by mouth daily.  Marland Kitchen atenolol (TENORMIN) 50 MG tablet Take 50 mg by mouth daily.   Marland Kitchen donepezil (ARICEPT) 10 MG tablet Take 10 mg by mouth every evening.   . fluticasone (FLONASE) 50 MCG/ACT nasal spray Place 2 sprays into both nostrils daily as needed for allergies or rhinitis.  . furosemide (LASIX) 20 MG tablet Take 40 mg by mouth every other day.  . guaifenesin (ROBITUSSIN) 100 MG/5ML syrup Take 400 mg by mouth every 6 (six) hours as needed for cough.  . isosorbide mononitrate (IMDUR)  120 MG 24 hr tablet Take 120 mg by mouth daily.   Marland Kitchen lovastatin (MEVACOR) 20 MG tablet Take 20 mg by mouth daily with supper.  . memantine (NAMENDA) 5 MG tablet Take 10 mg by mouth every 12 (twelve) hours.   . montelukast (SINGULAIR) 10 MG tablet Take 10 mg by mouth at bedtime.  . nitroGLYCERIN (NITROSTAT) 0.4 MG SL tablet Place 0.4 mg under the tongue every 5 (five)  minutes as needed for chest pain.  . polyethylene glycol (MIRALAX / GLYCOLAX) 17 g packet Take 17 g by mouth every 3 (three) days.  . polyvinyl alcohol (LIQUIFILM TEARS) 1.4 % ophthalmic solution Place 1 drop into both eyes 3 (three) times daily as needed for dry eyes.  Marland Kitchen QUEtiapine (SEROQUEL) 100 MG tablet Take 50 mg by mouth every morning.   Marland Kitchen QUEtiapine (SEROQUEL) 100 MG tablet Take 100 mg by mouth at bedtime.   . sennosides-docusate sodium (SENOKOT-S) 8.6-50 MG tablet Take 1 tablet by mouth at bedtime.   No facility-administered encounter medications on file as of 01/07/2020.     Annalee Meyerhoff Jenetta Downer, NP

## 2020-02-16 ENCOUNTER — Non-Acute Institutional Stay: Payer: Medicare Other | Admitting: Adult Health Nurse Practitioner

## 2020-02-16 ENCOUNTER — Other Ambulatory Visit: Payer: Self-pay

## 2020-02-16 DIAGNOSIS — F0390 Unspecified dementia without behavioral disturbance: Secondary | ICD-10-CM

## 2020-02-16 DIAGNOSIS — Z515 Encounter for palliative care: Secondary | ICD-10-CM

## 2020-02-16 NOTE — Progress Notes (Addendum)
[facility] Consult Note Telephone: [phone]  Fax: [phone]  PATIENT NAME: Veronica Jackson DOB: 1926-08-12 MRN: [id number]  PRIMARY CARE PROVIDER:   Kathie Rhodes, MD  REFERRING PROVIDER:  [facility]  RESPONSIBLE PARTY:   [facility] [employer] [phone] or [phone]  Fax [phone]    RECOMMENDATIONS and PLAN:  1.  Advance care planning. Patient is a full code. Patient has legal guardian through [place], Fayne Norrie. Spoke with Ms. Thomas to update on visit.  Encouraged to call with any questions or concerns.  2.  Dementia. FAST 6a. Patient is ambulatory and requires minimal assistance with ADLs. Continent of B&B. Today she has no new concerns. Denies pain, SOB, cough, fever, N/V/D, constipation, dizziness, headaches. Staff have no new concerns. No reported changes in appetite or weight.Continue supportive care at facility.  Patient is stable with no reported falls, infection, hospital visits since last visit.  Palliative care will continue to monitor for symptom management and decline and make recommendations as needed.Will follow up in 6-8 weeks  I spent 35 minutes providing this consultation,  from 12:10 to 12:45 including time with patient/family, chart review, provider coordination, documentation. More than 50% of the time in this consultation was spent coordinating communication.   HISTORY OF PRESENT ILLNESS:  Veronica Jackson is a 86 y.o. year old female with multiple medical problems including dementia, CAD, hypertension,anemia, chronic kidney disease, schizophrenia. Palliative Care was asked to help address goals of care.   CODE STATUS: full code  PPS: 50% HOSPICE ELIGIBILITY/DIAGNOSIS: TBD  PHYSICAL EXAM:  HR 67  O2 97% on RA General:sitting in chair in NAD Cardiovascular: regular rate and rhythm Pulmonary:lung sounds clear; normal respiratory effort Abdomen: soft, nontender, + bowel sounds GU: no suprapubic  tenderness Extremities: trace edema to bilateral ankles, no joint deformities Skin: no rashes on exposed skin Neurological: Weakness; A&O to person and place  PAST MEDICAL HISTORY:  Past Medical History:  Diagnosis Date  . Atherosclerotic cardiovascular disease   . Dementia ([place])   . Schizophrenia ([place])     SOCIAL HX:  Social History   Tobacco Use  . Smoking status: Never Smoker  . Smokeless tobacco: Never Used  Substance Use Topics  . Alcohol use: No    ALLERGIES:  Allergies  Allergen Reactions  . Bactrim [Sulfamethoxazole-Trimethoprim]   . Erythromycin Itching  . Penicillins Swelling  . Rhinocort [Budesonide] Nausea And Vomiting  . Sulfa Antibiotics Nausea Only  . Tetracyclines & Related Itching     PERTINENT MEDICATIONS:  Outpatient Encounter Medications as of 02/16/2020  Medication Sig  . albuterol (PROVENTIL HFA;VENTOLIN HFA) 108 (90 Base) MCG/ACT inhaler Inhale 2 puffs into the lungs every 6 (six) hours as needed for wheezing or shortness of breath.  Marland Kitchen aspirin 81 MG chewable tablet Chew 81 mg by mouth daily.  Marland Kitchen atenolol (TENORMIN) 50 MG tablet Take 50 mg by mouth daily.   Marland Kitchen donepezil (ARICEPT) 10 MG tablet Take 10 mg by mouth every evening.   . fluticasone (FLONASE) 50 MCG/ACT nasal spray Place 2 sprays into both nostrils daily as needed for allergies or rhinitis.  . furosemide (LASIX) 20 MG tablet Take 40 mg by mouth every other day.  . guaifenesin (ROBITUSSIN) 100 MG/5ML syrup Take 400 mg by mouth every 6 (six) hours as needed for cough.  . isosorbide mononitrate (IMDUR) 120 MG 24 hr tablet Take 120 mg by mouth daily.   Marland Kitchen lovastatin (MEVACOR) 20 MG tablet Take 20 mg by mouth  daily with supper.  . memantine (NAMENDA) 5 MG tablet Take 10 mg by mouth every 12 (twelve) hours.   . montelukast (SINGULAIR) 10 MG tablet Take 10 mg by mouth at bedtime.  . nitroGLYCERIN (NITROSTAT) 0.4 MG SL tablet Place 0.4 mg under the tongue every 5 (five) minutes as needed for chest  pain.  . polyethylene glycol (MIRALAX / GLYCOLAX) 17 g packet Take 17 g by mouth every 3 (three) days.  . polyvinyl alcohol (LIQUIFILM TEARS) 1.4 % ophthalmic solution Place 1 drop into both eyes 3 (three) times daily as needed for dry eyes.  Marland Kitchen QUEtiapine (SEROQUEL) 100 MG tablet Take 50 mg by mouth every morning.   Marland Kitchen QUEtiapine (SEROQUEL) 100 MG tablet Take 100 mg by mouth at bedtime.   . sennosides-docusate sodium (SENOKOT-S) 8.6-50 MG tablet Take 1 tablet by mouth at bedtime.   No facility-administered encounter medications on file as of 02/16/2020.    Antwon Rochin Marlena Clipper, NP

## 2020-04-19 ENCOUNTER — Non-Acute Institutional Stay: Payer: Medicare Other | Admitting: Adult Health Nurse Practitioner

## 2020-04-19 ENCOUNTER — Other Ambulatory Visit: Payer: Self-pay

## 2020-04-19 DIAGNOSIS — F0390 Unspecified dementia without behavioral disturbance: Secondary | ICD-10-CM

## 2020-04-19 DIAGNOSIS — Z515 Encounter for palliative care: Secondary | ICD-10-CM

## 2020-04-19 NOTE — Progress Notes (Signed)
Therapist, nutritional Palliative Care Consult Note Telephone: [phone]  Fax: [phone]  PATIENT NAME: Veronica Jackson DOB: June 11, 1926 MRN: [id number]  PRIMARY CARE PROVIDER:   Cristy Folks, MD  REFERRING PROVIDER:  [facility]  RESPONSIBLE PARTY:   [place] [facility] [facility] [phone] or [phone]  Fax [phone]       RECOMMENDATIONS and PLAN:  1.  Advance care planning. Patient is a full code. Patient has legal guardian through DSS, Hilbert Odor.  Left VM with legal guardian with contact info  2.  Dementia. FAST 6a. Patient is ambulatory and requires minimal assistance with ADLs. Continent of B&B. Today she has no new concerns. Denies pain, SOB, cough, fever, N/V/D, constipation, dizziness, headaches. Staff have no new concerns. No reported changes in appetite or weight.Continue supportive careat facility.  Patient has not had any falls, infection, hospital visits since last visit.  Palliative will continue to monitor for symptom management/decline and make recommendations as needed.  Will follow up in 6-8 weeks.  I spent 35 minutes providing this consultation,  from 10:35 to 11:05 including time spent with patient/family, chart review, provider coordination, documentation. More than 50% of the time in this consultation was spent coordinating communication.   HISTORY OF PRESENT ILLNESS:  Veronica Jackson is a 86 y.o. year old female with multiple medical problems including dementia, CAD, hypertension,anemia, chronic kidney disease, schizophrenia. Palliative Care was asked to help address goals of care.   CODE STATUS: full code  PPS: 50% HOSPICE ELIGIBILITY/DIAGNOSIS: TBD  PHYSICAL EXAM:  HR 84 O2 95% on RA General:sitting in chair in NAD Cardiovascular: regular rate and rhythm Pulmonary:lung sounds clear; normal respiratory effort Abdomen: soft, nontender, + bowel sounds GU: no suprapubic tenderness Extremities:trace edema to bilateral ankles, no  joint deformities Skin: no rasheson exposed skin Neurological: Weakness; A&O to person and place  PAST MEDICAL HISTORY:  Past Medical History:  Diagnosis Date  . Atherosclerotic cardiovascular disease   . Dementia (HCC)   . Schizophrenia (HCC)     SOCIAL HX:  Social History   Tobacco Use  . Smoking status: Never Smoker  . Smokeless tobacco: Never Used  Substance Use Topics  . Alcohol use: No    ALLERGIES:  Allergies  Allergen Reactions  . Bactrim [Sulfamethoxazole-Trimethoprim]   . Erythromycin Itching  . Penicillins Swelling  . Rhinocort [Budesonide] Nausea And Vomiting  . Sulfa Antibiotics Nausea Only  . Tetracyclines & Related Itching     PERTINENT MEDICATIONS:  Outpatient Encounter Medications as of 04/19/2020  Medication Sig  . albuterol (PROVENTIL HFA;VENTOLIN HFA) 108 (90 Base) MCG/ACT inhaler Inhale 2 puffs into the lungs every 6 (six) hours as needed for wheezing or shortness of breath.  Marland Kitchen aspirin 81 MG chewable tablet Chew 81 mg by mouth daily.  Marland Kitchen atenolol (TENORMIN) 50 MG tablet Take 50 mg by mouth daily.   Marland Kitchen donepezil (ARICEPT) 10 MG tablet Take 10 mg by mouth every evening.   . fluticasone (FLONASE) 50 MCG/ACT nasal spray Place 2 sprays into both nostrils daily as needed for allergies or rhinitis.  . furosemide (LASIX) 20 MG tablet Take 40 mg by mouth every other day.  . guaifenesin (ROBITUSSIN) 100 MG/5ML syrup Take 400 mg by mouth every 6 (six) hours as needed for cough.  . isosorbide mononitrate (IMDUR) 120 MG 24 hr tablet Take 120 mg by mouth daily.   Marland Kitchen lovastatin (MEVACOR) 20 MG tablet Take 20 mg by mouth daily with supper.  . memantine (NAMENDA) 5 MG tablet  Take 10 mg by mouth every 12 (twelve) hours.   . montelukast (SINGULAIR) 10 MG tablet Take 10 mg by mouth at bedtime.  . nitroGLYCERIN (NITROSTAT) 0.4 MG SL tablet Place 0.4 mg under the tongue every 5 (five) minutes as needed for chest pain.  . polyethylene glycol (MIRALAX / GLYCOLAX) 17 g packet  Take 17 g by mouth every 3 (three) days.  . polyvinyl alcohol (LIQUIFILM TEARS) 1.4 % ophthalmic solution Place 1 drop into both eyes 3 (three) times daily as needed for dry eyes.  Marland Kitchen QUEtiapine (SEROQUEL) 100 MG tablet Take 50 mg by mouth every morning.   Marland Kitchen QUEtiapine (SEROQUEL) 100 MG tablet Take 100 mg by mouth at bedtime.   . sennosides-docusate sodium (SENOKOT-S) 8.6-50 MG tablet Take 1 tablet by mouth at bedtime.   No facility-administered encounter medications on file as of 04/19/2020.      Shalanda Brogden Marlena Clipper, NP

## 2020-05-24 ENCOUNTER — Non-Acute Institutional Stay: Payer: Medicare Other | Admitting: Adult Health Nurse Practitioner

## 2020-05-24 ENCOUNTER — Other Ambulatory Visit: Payer: Self-pay

## 2020-05-24 DIAGNOSIS — F0390 Unspecified dementia without behavioral disturbance: Secondary | ICD-10-CM

## 2020-05-24 DIAGNOSIS — Z515 Encounter for palliative care: Secondary | ICD-10-CM

## 2020-05-24 NOTE — Progress Notes (Signed)
Therapist, nutritional Palliative Care Consult Note Telephone: [phone]  Fax: [phone]  PATIENT NAME: Veronica Jackson DOB: 01/30/1926 MRN: [id number]  PRIMARY CARE PROVIDER:   Cristy Folks, MD  REFERRING PROVIDER:  [facility]  RESPONSIBLE PARTY:   [place] [facility] [facility] [phone] or [phone] Fax [phone]       RECOMMENDATIONS and PLAN:  1.  Advance care planning. Patient is a full code. Patient has legal guardian through DSS, Hilbert Odor.  Left VM with legal guardian with contact info  2.  Dementia. FAST 6a. Patient is ambulatory and requires minimal assistance with ADLs. Continent of B&B. Today she has no new concerns. Denies pain, SOB, cough, fever, N/V/D, constipation, dizziness, headaches. Staff have no new concerns. No reported changes in appetite or weight.Continue supportive careat facility.  Patient has not had any falls, infection, hospital visits since last visit.Denies pain, headaches, dizziness, chest pain, increased SOB or cough, N/V/D, constipation, dysuria, hematuria. Palliative will continue to monitor for symptom management/decline and make recommendations as needed.  Will follow up in 6-8 weeks.  I spent 30 minutes providing this consultation,  from 10:30 to 11:00 including time spent with patient/family, chart review, provider coordination, documentation. More than 50% of the time in this consultation was spent coordinating communication.   HISTORY OF PRESENT ILLNESS:  Veronica Jackson is a 86 y.o. year old female with multiple medical problems including dementia, CAD, hypertension,anemia, chronic kidney disease, schizophrenia. Palliative Care was asked to help address goals of care.   CODE STATUS: full code  PPS: 50% HOSPICE ELIGIBILITY/DIAGNOSIS: TBD  PHYSICAL EXAM:  HR70O2 98% on RA General:sitting in chair in NAD Cardiovascular: regular rate and rhythm Pulmonary:lung sounds clear; normal respiratory effort Abdomen:  soft, nontender, + bowel sounds GU: no suprapubic tenderness Extremities:trace edema to bilateral ankles, no joint deformities Skin: no rasheson exposed skin Neurological: Weakness; A&O to person and place    PAST MEDICAL HISTORY:  Past Medical History:  Diagnosis Date   Atherosclerotic cardiovascular disease    Dementia (HCC)    Schizophrenia (HCC)     SOCIAL HX:  Social History   Tobacco Use   Smoking status: Never Smoker   Smokeless tobacco: Never Used  Substance Use Topics   Alcohol use: No    ALLERGIES:  Allergies  Allergen Reactions   Bactrim [Sulfamethoxazole-Trimethoprim]    Erythromycin Itching   Penicillins Swelling   Rhinocort [Budesonide] Nausea And Vomiting   Sulfa Antibiotics Nausea Only   Tetracyclines & Related Itching     PERTINENT MEDICATIONS:  Outpatient Encounter Medications as of 05/24/2020  Medication Sig   albuterol (PROVENTIL HFA;VENTOLIN HFA) 108 (90 Base) MCG/ACT inhaler Inhale 2 puffs into the lungs every 6 (six) hours as needed for wheezing or shortness of breath.   aspirin 81 MG chewable tablet Chew 81 mg by mouth daily.   atenolol (TENORMIN) 50 MG tablet Take 50 mg by mouth daily.    donepezil (ARICEPT) 10 MG tablet Take 10 mg by mouth every evening.    fluticasone (FLONASE) 50 MCG/ACT nasal spray Place 2 sprays into both nostrils daily as needed for allergies or rhinitis.   furosemide (LASIX) 20 MG tablet Take 40 mg by mouth every other day.   guaifenesin (ROBITUSSIN) 100 MG/5ML syrup Take 400 mg by mouth every 6 (six) hours as needed for cough.   isosorbide mononitrate (IMDUR) 120 MG 24 hr tablet Take 120 mg by mouth daily.    lovastatin (MEVACOR) 20 MG tablet Take 20 mg by  mouth daily with supper.   memantine (NAMENDA) 5 MG tablet Take 10 mg by mouth every 12 (twelve) hours.    montelukast (SINGULAIR) 10 MG tablet Take 10 mg by mouth at bedtime.   nitroGLYCERIN (NITROSTAT) 0.4 MG SL tablet Place 0.4 mg under  the tongue every 5 (five) minutes as needed for chest pain.   polyethylene glycol (MIRALAX / GLYCOLAX) 17 g packet Take 17 g by mouth every 3 (three) days.   polyvinyl alcohol (LIQUIFILM TEARS) 1.4 % ophthalmic solution Place 1 drop into both eyes 3 (three) times daily as needed for dry eyes.   QUEtiapine (SEROQUEL) 100 MG tablet Take 50 mg by mouth every morning.    QUEtiapine (SEROQUEL) 100 MG tablet Take 100 mg by mouth at bedtime.    sennosides-docusate sodium (SENOKOT-S) 8.6-50 MG tablet Take 1 tablet by mouth at bedtime.   No facility-administered encounter medications on file as of 05/24/2020.     Cullin Dishman Marlena Clipper, NP

## 2020-08-25 ENCOUNTER — Other Ambulatory Visit: Payer: Self-pay

## 2020-08-25 ENCOUNTER — Non-Acute Institutional Stay: Payer: Medicare Other | Admitting: Adult Health Nurse Practitioner

## 2020-08-25 DIAGNOSIS — M545 Low back pain, unspecified: Secondary | ICD-10-CM

## 2020-08-25 DIAGNOSIS — F0390 Unspecified dementia without behavioral disturbance: Secondary | ICD-10-CM

## 2020-08-25 NOTE — Progress Notes (Signed)
Therapist, nutritional Palliative Care Consult Note Telephone: [phone]  Fax: [phone]  PATIENT NAME: Veronica Jackson DOB: 05-Mar-1926 MRN: [id number]  PRIMARY CARE PROVIDER:   Cristy Folks, MD  REFERRING PROVIDER:  [facility]  RESPONSIBLE PARTY:Vanda Florentina Addison [phone] or [phone] Fax [phone]   RECOMMENDATIONS and PLAN: 1.Advance care planning. Patient is a full code. Patient has legal guardian through DSS, Hilbert Odor.Left VM with legal guardian with contact info  2.Dementia. FAST 6a. Patient is ambulatory and requires minimal assistance with ADLs. Continent of B&B.   3.  Pain.  Patient states having pain in her back.  Patient does have history of lumbago.  Patient unable to give details of character or duration of pain secondary to dementia.  Patient does state that she injured her back 15 years ago and that it is just acting up today.  Patient has had recent visit with PCP about her pain.  Her pain has not affected her activity, ADLs, sleep.  Has been getting Tylenol as needed.  We will continue to monitor pain at each visit.  Patient has not had any falls, infections, hospital visits since last visit.  Palliative will continue to monitor for symptom management/decline and make recommendations as needed.  Follow-up in 8 to 10 weeks.  I spent 30 minutes providing this consultation. More than 50% of the time in this consultation was spent coordinating communication.   HISTORY OF PRESENT ILLNESS:  Veronica Jackson is a 86 y.o. year old female with multiple medical problems including dementia, CAD, hypertension,anemia, chronic kidney disease, schizophrenia. Palliative Care was asked to help address goals of care.  See about pain above.  Patient has no other complaints.  States that she is feeling good.  Denies shortness of breath, cough, N/V/D, constipation, dysuria.  CODE STATUS:   PPS: 0% HOSPICE ELIGIBILITY/DIAGNOSIS:  TBD  PHYSICAL EXAM:  BP 128/60 HR71O2 98% on RA General:sitting in chair in NAD Cardiovascular: regular rate and rhythm Pulmonary:lung sounds clear; normal respiratory effort Abdomen: soft, nontender, + bowel sounds Extremities:trace edema to bilateral ankles, no joint deformities Skin: no rasheson exposed skin Neurological: Weakness; A&O to person and place  PAST MEDICAL HISTORY:  Past Medical History:  Diagnosis Date  . Atherosclerotic cardiovascular disease   . Dementia (HCC)   . Schizophrenia (HCC)     SOCIAL HX:  Social History   Tobacco Use  . Smoking status: Never Smoker  . Smokeless tobacco: Never Used  Substance Use Topics  . Alcohol use: No    ALLERGIES:  Allergies  Allergen Reactions  . Bactrim [Sulfamethoxazole-Trimethoprim]   . Erythromycin Itching  . Penicillins Swelling  . Rhinocort [Budesonide] Nausea And Vomiting  . Sulfa Antibiotics Nausea Only  . Tetracyclines & Related Itching     PERTINENT MEDICATIONS:  Outpatient Encounter Medications as of 08/25/2020  Medication Sig  . albuterol (PROVENTIL HFA;VENTOLIN HFA) 108 (90 Base) MCG/ACT inhaler Inhale 2 puffs into the lungs every 6 (six) hours as needed for wheezing or shortness of breath.  Marland Kitchen aspirin 81 MG chewable tablet Chew 81 mg by mouth daily.  Marland Kitchen atenolol (TENORMIN) 50 MG tablet Take 50 mg by mouth daily.   Marland Kitchen donepezil (ARICEPT) 10 MG tablet Take 10 mg by mouth every evening.   . fluticasone (FLONASE) 50 MCG/ACT nasal spray Place 2 sprays into both nostrils daily as needed for allergies or rhinitis.  . furosemide (LASIX) 20 MG tablet Take 40 mg by mouth every other day.  . guaifenesin (ROBITUSSIN) 100  MG/5ML syrup Take 400 mg by mouth every 6 (six) hours as needed for cough.  . isosorbide mononitrate (IMDUR) 120 MG 24 hr tablet Take 120 mg by mouth daily.   Marland Kitchen lovastatin (MEVACOR) 20 MG tablet Take 20 mg by mouth daily with supper.  . memantine (NAMENDA) 5 MG tablet Take 10 mg by mouth  every 12 (twelve) hours.   . montelukast (SINGULAIR) 10 MG tablet Take 10 mg by mouth at bedtime.  . nitroGLYCERIN (NITROSTAT) 0.4 MG SL tablet Place 0.4 mg under the tongue every 5 (five) minutes as needed for chest pain.  . polyethylene glycol (MIRALAX / GLYCOLAX) 17 g packet Take 17 g by mouth every 3 (three) days.  . polyvinyl alcohol (LIQUIFILM TEARS) 1.4 % ophthalmic solution Place 1 drop into both eyes 3 (three) times daily as needed for dry eyes.  Marland Kitchen QUEtiapine (SEROQUEL) 100 MG tablet Take 50 mg by mouth every morning.   Marland Kitchen QUEtiapine (SEROQUEL) 100 MG tablet Take 100 mg by mouth at bedtime.   . sennosides-docusate sodium (SENOKOT-S) 8.6-50 MG tablet Take 1 tablet by mouth at bedtime.   No facility-administered encounter medications on file as of 08/25/2020.     Alaiza Yau Marlena Clipper, NP

## 2020-11-29 ENCOUNTER — Non-Acute Institutional Stay: Payer: Medicare Other | Admitting: Adult Health Nurse Practitioner

## 2020-11-29 ENCOUNTER — Other Ambulatory Visit: Payer: Self-pay

## 2020-11-29 DIAGNOSIS — F0390 Unspecified dementia without behavioral disturbance: Secondary | ICD-10-CM

## 2020-11-29 DIAGNOSIS — Z515 Encounter for palliative care: Secondary | ICD-10-CM

## 2020-11-29 NOTE — Progress Notes (Signed)
Therapist, nutritional Palliative Care Consult Note Telephone: [phone]  Fax: [phone]  PATIENT NAME: Veronica Jackson DOB: [birth year] MRN: [id number]  PRIMARY CARE PROVIDER:   [facility]  REFERRING PROVIDER:  [facility]  RESPONSIBLE PARTY:Vanda Florentina Addison [phone] or [phone] Fax [phone]   RECOMMENDATIONS and PLAN: 1.Advance care planning. Patient is DNR.  Faxed note to guardian  2.  Dementia.  FAST 6a. Patient is ambulatory and requires minimal assistance with ADLs. Continent of B&B. She is having increased anxiety/agitation.  Waiting on pharmacy to send ativan gel.  Will follow up in 2 weeks to see if patient has received the ativan and for effectiveness.  Continue supportive care at the facility.  Patient has not had any falls, infections, hospital visits since last visit.  Palliative will continue to monitor for symptom management/decline and make recommendations as needed.  Follow-up in 2 weeks.  I spent 40 minutes providing this consultation, including time spent with patient, provider coordination, chart review, documentation. More than 50% of the time in this consultation was spent coordinating communication.   HISTORY OF PRESENT ILLNESS:  Veronica Jackson is a 87 y.o. 87 year old female with multiple medical problems including dementia, CAD, hypertension,anemia, chronic kidney disease, schizophrenia. Palliative Care was asked to help address goals of care. Patient states she is fine.  HPI/ROS unreliable secondary to dementia.  Staff reports that she is having more frequent episodes of agitation. States that she will bite and hit.  States that the episodes used to occur once every 3 months and now are occurring weekly.  She is on seroquel 25 mg half tab in the AM PRN and whole tab QHS, Namenda 10 mg BID, and Aricept 10mg  daily.  She has recent order for ativan gel PRN but this has yet to come in from the pharmacy.  Her appetite is good with no  reported weight loss.    CODE STATUS: DNR  PPS: 40% HOSPICE ELIGIBILITY/DIAGNOSIS: TBD  PHYSICAL EXAM:  HR85O2 95% on RA General:sitting in chair in NAD Eyes: sclera anicteric and noninjected with no discharge noted ENMT: moist mucous membranes Cardiovascular: regular rate and rhythm Pulmonary:lung sounds clear; normal respiratory effort Abdomen: soft, nontender, + bowel sounds Extremities:trace edema to bilateral ankles, no joint deformities Skin: no rasheson exposed skin Neurological: Weakness; A&O to person and place  PAST MEDICAL HISTORY:  Past Medical History:  Diagnosis Date  . Atherosclerotic cardiovascular disease   . Dementia (HCC)   . Schizophrenia (HCC)     SOCIAL HX:  Social History   Tobacco Use  . Smoking status: Never Smoker  . Smokeless tobacco: Never Used  Substance Use Topics  . Alcohol use: No    ALLERGIES:  Allergies  Allergen Reactions  . Bactrim [Sulfamethoxazole-Trimethoprim]   . Erythromycin Itching  . Penicillins Swelling  . Rhinocort [Budesonide] Nausea And Vomiting  . Sulfa Antibiotics Nausea Only  . Tetracyclines & Related Itching     PERTINENT MEDICATIONS:  Outpatient Encounter Medications as of 11/29/2020  Medication Sig  . albuterol (PROVENTIL HFA;VENTOLIN HFA) 108 (90 Base) MCG/ACT inhaler Inhale 2 puffs into the lungs every 6 (six) hours as needed for wheezing or shortness of breath.  01/29/2021 aspirin 81 MG chewable tablet Chew 81 mg by mouth daily.  Marland Kitchen atenolol (TENORMIN) 50 MG tablet Take 50 mg by mouth daily.   Marland Kitchen donepezil (ARICEPT) 10 MG tablet Take 10 mg by mouth every evening.   . fluticasone (FLONASE) 50 MCG/ACT nasal spray Place 2 sprays into  both nostrils daily as needed for allergies or rhinitis.  . furosemide (LASIX) 20 MG tablet Take 40 mg by mouth every other day.  . guaifenesin (ROBITUSSIN) 100 MG/5ML syrup Take 400 mg by mouth every 6 (six) hours as needed for cough.  . isosorbide mononitrate (IMDUR) 120 MG 24 hr  tablet Take 120 mg by mouth daily.   Marland Kitchen lovastatin (MEVACOR) 20 MG tablet Take 20 mg by mouth daily with supper.  . memantine (NAMENDA) 5 MG tablet Take 10 mg by mouth every 12 (twelve) hours.   . montelukast (SINGULAIR) 10 MG tablet Take 10 mg by mouth at bedtime.  . nitroGLYCERIN (NITROSTAT) 0.4 MG SL tablet Place 0.4 mg under the tongue every 5 (five) minutes as needed for chest pain.  . polyethylene glycol (MIRALAX / GLYCOLAX) 17 g packet Take 17 g by mouth every 3 (three) days.  . polyvinyl alcohol (LIQUIFILM TEARS) 1.4 % ophthalmic solution Place 1 drop into both eyes 3 (three) times daily as needed for dry eyes.  Marland Kitchen QUEtiapine (SEROQUEL) 100 MG tablet Take 50 mg by mouth every morning.   Marland Kitchen QUEtiapine (SEROQUEL) 100 MG tablet Take 100 mg by mouth at bedtime.   . sennosides-docusate sodium (SENOKOT-S) 8.6-50 MG tablet Take 1 tablet by mouth at bedtime.   No facility-administered encounter medications on file as of 11/29/2020.      Uchechukwu Dhawan Marlena Clipper, NP

## 2021-02-01 ENCOUNTER — Emergency Department
Admission: EM | Admit: 2021-02-01 | Discharge: 2021-02-02 | Disposition: A | Discharge status: Home / Self Care | Payer: Medicare Other | Attending: Emergency Medicine | Admitting: Emergency Medicine

## 2021-02-01 ENCOUNTER — Other Ambulatory Visit: Payer: Self-pay

## 2021-02-01 DIAGNOSIS — N183 Chronic kidney disease, stage 3 unspecified: Secondary | ICD-10-CM | POA: Insufficient documentation

## 2021-02-01 DIAGNOSIS — R456 Violent behavior: Secondary | ICD-10-CM | POA: Insufficient documentation

## 2021-02-01 DIAGNOSIS — F0390 Unspecified dementia without behavioral disturbance: Secondary | ICD-10-CM | POA: Diagnosis not present

## 2021-02-01 DIAGNOSIS — I2510 Atherosclerotic heart disease of native coronary artery without angina pectoris: Secondary | ICD-10-CM | POA: Diagnosis not present

## 2021-02-01 DIAGNOSIS — Z7982 Long term (current) use of aspirin: Secondary | ICD-10-CM | POA: Diagnosis not present

## 2021-02-01 DIAGNOSIS — R4689 Other symptoms and signs involving appearance and behavior: Secondary | ICD-10-CM

## 2021-02-01 LAB — CBC WITH DIFFERENTIAL/PLATELET
Abs Immature Granulocytes: 0.05 10*3/uL (ref 0.00–0.07)
Basophils Absolute: 0 10*3/uL (ref 0.0–0.1)
Basophils Relative: 1 %
Eosinophils Absolute: 0.1 10*3/uL (ref 0.0–0.5)
Eosinophils Relative: 2 %
HCT: 34 % — ABNORMAL LOW (ref 36.0–46.0)
Hemoglobin: 10.7 g/dL — ABNORMAL LOW (ref 12.0–15.0)
Immature Granulocytes: 1 %
Lymphocytes Relative: 30 %
Lymphs Abs: 1.9 10*3/uL (ref 0.7–4.0)
MCH: 29.8 pg (ref 26.0–34.0)
MCHC: 31.5 g/dL (ref 30.0–36.0)
MCV: 94.7 fL (ref 80.0–100.0)
Monocytes Absolute: 0.7 10*3/uL (ref 0.1–1.0)
Monocytes Relative: 11 %
Neutro Abs: 3.6 10*3/uL (ref 1.7–7.7)
Neutrophils Relative %: 55 %
Platelets: 220 10*3/uL (ref 150–400)
RBC: 3.59 MIL/uL — ABNORMAL LOW (ref 3.87–5.11)
RDW: 12.4 % (ref 11.5–15.5)
WBC: 6.4 10*3/uL (ref 4.0–10.5)
nRBC: 0 % (ref 0.0–0.2)

## 2021-02-01 LAB — URINALYSIS, COMPLETE (UACMP) WITH MICROSCOPIC
Bacteria, UA: NONE SEEN
Bilirubin Urine: NEGATIVE
Glucose, UA: NEGATIVE mg/dL
Hgb urine dipstick: NEGATIVE
Ketones, ur: NEGATIVE mg/dL
Leukocytes,Ua: NEGATIVE
Nitrite: NEGATIVE
Protein, ur: NEGATIVE mg/dL
Specific Gravity, Urine: 1.005 (ref 1.005–1.030)
WBC, UA: NONE SEEN WBC/hpf (ref 0–5)
pH: 6 (ref 5.0–8.0)

## 2021-02-01 LAB — BASIC METABOLIC PANEL
Anion gap: 10 (ref 5–15)
BUN: 18 mg/dL (ref 8–23)
CO2: 27 mmol/L (ref 22–32)
Calcium: 8.8 mg/dL — ABNORMAL LOW (ref 8.9–10.3)
Chloride: 104 mmol/L (ref 98–111)
Creatinine, Ser: 1.26 mg/dL — ABNORMAL HIGH (ref 0.44–1.00)
GFR, Estimated: 40 mL/min — ABNORMAL LOW (ref >60)
Glucose, Bld: 110 mg/dL — ABNORMAL HIGH (ref 70–99)
Potassium: 3.7 mmol/L (ref 3.5–5.1)
Sodium: 141 mmol/L (ref 135–145)

## 2021-02-01 NOTE — ED Notes (Addendum)
Pt ambulated into hallway carrying purse and walking toward ambulance door. Pt sts she is finding her way out. Pt updated on POC and is easily redirectible back to room. Dr Derrill Kay and and Charge RN Jeannett Senior aware.  Pt sitting in chair in room at this time. Beverage provided. Pt remains confused, cooperative, and calm at this time.

## 2021-02-01 NOTE — Discharge Instructions (Signed)
Please seek medical attention for any high fevers, chest pain, shortness of breath, change in behavior, persistent vomiting, bloody stool or any other new or concerning symptoms.  
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
## 2021-02-01 NOTE — ED Notes (Addendum)
NT Shanna at bedside to sit with patient. Pt remains calm, alert, psych nurse at bedside.

## 2021-02-02 ENCOUNTER — Non-Acute Institutional Stay: Payer: Medicare Other | Admitting: Adult Health Nurse Practitioner

## 2021-02-02 ENCOUNTER — Encounter: Payer: Self-pay | Admitting: Adult Health Nurse Practitioner

## 2021-02-02 DIAGNOSIS — F0390 Unspecified dementia without behavioral disturbance: Secondary | ICD-10-CM

## 2021-02-02 DIAGNOSIS — R4689 Other symptoms and signs involving appearance and behavior: Secondary | ICD-10-CM

## 2021-02-02 DIAGNOSIS — Z515 Encounter for palliative care: Secondary | ICD-10-CM
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
## 2021-02-02 NOTE — ED Notes (Signed)
Spoke with Kevan Rosebush at [facility] who reports concern for patient's increased combativeness over the past few days. Discussed results and discharge plan with Olegario Messier who is attempting to get in touch with RN on duty.

## 2021-02-02 NOTE — ED Notes (Signed)
Pt awake, calm, redirectible. Pt provided juice per request.

## 2021-02-02 NOTE — ED Notes (Signed)
Report called to [facility] at  Hospital. Per Olegario Messier, RN on duty is aware pt is being discharged back to facility. Transport requested at this time.

## 2021-02-02 NOTE — Progress Notes (Signed)
[facility] Consult Note Telephone: [phone]  Fax: [phone]    Date of encounter: 02/02/21 PATIENT NAME: Veronica Jackson [place] [address]   [phone] (home)  DOB: January 25, 1926 MRN: [id number] PRIMARY CARE PROVIDER:    Thea Gist, NP  REFERRING PROVIDER:   Thea Gist, NP  RESPONSIBLE PARTY:    Contact Information    Name Relation Home Work Mobile   Veronica Jackson Sister [phone]     Veronica Jackson Sister   [phone]    Patient has DSS guardian, Veronica Jackson, phone [phone] or [phone] fax [phone]   I met face to face with patient in facility. Palliative Care was asked to follow this patient by consultation request of Thea Gist, NP to address advance care planning and complex medical decision making. This is a follow up visit.  Called guardian, Veronica Jackson, to update on today's visit.  Left voice message with reason for call and callback info.  We will fax her my note                                  ASSESSMENT AND PLAN / RECOMMENDATIONS:   Advance Care Planning/Goals of Care: Goals include to maximize quality of life and symptom management.  CODE STATUS: DNR  Symptom Management/Plan:  Dementia/abnormal behavior: This was abnormal behavior for patient.  Have reached out to PCP for referral for psych consult.   Follow up Palliative Care Visit: Palliative care will continue to follow for complex medical decision making, advance care planning, and clarification of goals. Return 4-6 weeks or prn.  I spent 40 minutes providing this consultation. More than 50% of the time in this consultation was spent in counseling and care coordination.  PPS: 40%  HOSPICE ELIGIBILITY/DIAGNOSIS: TBD  Chief Complaint: Follow-up palliative visit  HISTORY OF PRESENT ILLNESS:  Veronica Jackson is a 87 y.o. year old female  with dementia, CAD, hypertension,anemia, chronic kidney disease,  schizophrenia.  Patient had ER visit yesterday due to abnormal combative behavior.  Patient attempted to leave the floor of the memory care unit on the elevator, was hitting staff, punching the wall.  EMS came and gave her IM Haldol.  She was much calmer by the time she got to the emergency room.  Lab work and UA showed no abnormal findings.  Patient is currently on Aricept, Namenda, and Seroquel.  Patient does have a history of schizophrenia.  Patient is sleeping a lot today related to Haldol injection that she was given yesterday.  Staff had questions about if she needed a psych consult.  Patient has had behavior in the past where she did put on her hat and get her pocketbook and want to leave but not to where she was combative.  Other than this no other concerns were reported by staff.  History obtained from review of EMR and interview with  facility staff and Veronica Jackson.  I reviewed available labs, medications, imaging, studies and related documents from the EMR.  Records reviewed and summarized above.   PHYSICAL EXAM:  General:sitting in chair in NAD Pulmonary:normal respiratory effort Abdomen: soft, nontender, + bowel sounds Extremities:trace edema to bilateral ankles, no joint deformities Skin: no rasheson exposed skin Neurological: Weakness; patient sleeping a lot today due to haldol given yesterday  Thank you for the opportunity to participate in the care of Veronica Jackson.  The palliative care team will continue  to follow. Please call our office at [phone] if we can be of additional assistance.   Veronica Freels Jenetta Downer, NP , DNP  This chart was dictated using voice recognition software. Despite best efforts to proofread, errors can occur which can change the documentation meaning.   COVID-19 PATIENT SCREENING TOOL Asked and negative response unless otherwise noted:   Have you had symptoms of covid, tested positive or been in contact with someone with symptoms/positive test in the past  5-10 days?  Negative

## 2021-02-28 ENCOUNTER — Other Ambulatory Visit: Payer: Self-pay

## 2021-02-28 ENCOUNTER — Encounter: Payer: Self-pay | Admitting: Adult Health Nurse Practitioner

## 2021-02-28 ENCOUNTER — Non-Acute Institutional Stay: Payer: Medicare Other | Admitting: Adult Health Nurse Practitioner

## 2021-02-28 DIAGNOSIS — F0390 Unspecified dementia without behavioral disturbance: Secondary | ICD-10-CM

## 2021-02-28 DIAGNOSIS — Z515 Encounter for palliative care: Secondary | ICD-10-CM

## 2021-02-28 NOTE — Progress Notes (Signed)
[facility] Consult Note Telephone: [phone]  Fax: [phone]    Date of encounter: 02/28/21 PATIENT NAME: Veronica Jackson [place] [address]   [phone] (home)  DOB: 06-27-1926 MRN: [id number] PRIMARY CARE PROVIDER:    Thea Gist, NP  REFERRING PROVIDER:   Thea Gist, NP  RESPONSIBLE PARTY:    Contact Information    Name Relation Home Work Mobile   [place] Sister [phone]     Grayland Ormond Sister   [place] [phone] or [phone] Fax [phone]  I met face to face with patient in facility. Palliative Care was asked to follow this patient by consultation request of Veronica Gist, NP to address advance care planning and complex medical decision making. This is a follow up visit.                                   ASSESSMENT AND PLAN / RECOMMENDATIONS:   Advance Care Planning/Goals of Care: Goals include to maximize quality of life and symptom management.   CODE STATUS: DNR  Symptom Management/Plan:  Dementia: Patient still having some agitation related to sundowning.  She has had recent changes in her medications with the addition of Depakote.  PCP is managing this and is waiting for lab work to determine any changes in Depakote dosage.  PCP has mentioned before that they are trying to get psych consult for her.  Continue supportive care at the facility    Follow up Palliative Care Visit: Palliative care will continue to follow for complex medical decision making, advance care planning, and clarification of goals. Return 8-10 weeks or prn.  I spent 30 minutes providing this consultation. More than 50% of the time in this consultation was spent in counseling and care coordination.   PPS: 40%  HOSPICE ELIGIBILITY/DIAGNOSIS: TBD  Chief Complaint: Follow-up palliative visit  HISTORY OF PRESENT ILLNESS:  Veronica Jackson is a 87 y.o. year old  female  with dementia, CAD, hypertension,anemia, chronic kidney disease, schizophrenia.  Patient still has episodes in which she tries to leave the memory care unit thinking that she is leaving work to go home.  She has been started on Depakote and Ativan gel.  Waiting on results of lab work to determine if they can increase the Depakote.  This is being managed by her PCP at the facility.  Patient does have wound to her right side caused by the underwire from her bra.  This is being managed by home health RN.  History obtained from review of EMR and interview with facility staff and Veronica Jackson.    PHYSICAL EXAM:  General:sitting in chair in NAD Eyes: sclera anicteric and noninjected with no discharge noted ENMT: moist mucous membranes Cardiovascular: regular rate and rhythm Pulmonary:lung sounds clear; normal respiratory effort Abdomen: soft, nontender, + bowel sounds Extremities:trace edema to bilateral ankles, no joint deformities Skin: no rasheson exposed skin Neurological: Weakness; A&O to person    Thank you for the opportunity to participate in the care of Veronica Jackson.  The palliative care team will continue to follow. Please call our office at [phone] if we can be of additional assistance.   Shawnia Jackson Veronica Downer, NP , DNP  This chart was dictated using voice recognition software. Despite best efforts to proofread, errors can occur which can change the documentation meaning.   COVID-19 PATIENT SCREENING  TOOL Asked and negative response unless otherwise noted:   Have you had symptoms of covid, tested positive or been in contact with someone with symptoms/positive test in the past 5-10 days?  Negative

## 2021-05-04 ENCOUNTER — Non-Acute Institutional Stay: Payer: Medicare Other | Admitting: Student

## 2021-05-04 ENCOUNTER — Other Ambulatory Visit: Payer: Self-pay

## 2021-05-04 DIAGNOSIS — F0391 Unspecified dementia with behavioral disturbance: Secondary | ICD-10-CM

## 2021-05-04 DIAGNOSIS — N390 Urinary tract infection, site not specified: Secondary | ICD-10-CM

## 2021-05-04 DIAGNOSIS — Z515 Encounter for palliative care: Secondary | ICD-10-CM

## 2021-05-04 NOTE — Progress Notes (Signed)
[facility] Consult Note Telephone: [phone]  Fax: [phone]    Date of encounter: 05/04/21 PATIENT NAME: Veronica Jackson [place] [address]   [phone] (home)  DOB: 1926-01-19 MRN: [id number] PRIMARY CARE PROVIDER:    Thea Gist, NP  REFERRING PROVIDER:   Thea Gist, NP  RESPONSIBLE PARTY:    Contact Information     Name Relation Home Work Mobile   [place] Sister [phone]     Grayland Ormond Sister   [place] [phone] or [phone], Fax [phone]  I met face to face with patient in the facility. Palliative Care was asked to follow this patient by consultation request of  Thea Gist, NP to address advance care planning and complex medical decision making. This is a follow up visit.                                   ASSESSMENT AND PLAN / RECOMMENDATIONS:   Advance Care Planning/Goals of Care: Goals include to maximize quality of life and symptom management.   CODE STATUS: DNR  Symptom Management/Plan:  Dementia-patient requires cueing/direction. Agitation improved with medication changes per staff; Depakote increased to TID yesterday. Continue lorazepam and Seroquel as directed. Monitor for functional and cognitive declines. Monitor for falls, safety.   Suspected UTI-continue Cipro as directed; managed per PCP. Monitor for signs of UTI, fever, chills. Encourage adequate fluid intake.   Follow up Palliative Care Visit: Palliative care will continue to follow for complex medical decision making, advance care planning, and clarification of goals. Return in 8 weeks or prn.  I spent 25 minutes providing this consultation. More than 50% of the time in this consultation was spent in counseling and care coordination.   PPS: 40%  HOSPICE ELIGIBILITY/DIAGNOSIS: TBD  Chief Complaint: Palliative Medicine follow up visit.   HISTORY OF PRESENT  ILLNESS:  HALLA CHOPP is a 87 y.o. year old female  with dementia, hypertension, CAD, anemia, schizophrenia.   Patient resides at [facility] on dementia unit. Staff report patient being stable. She is currently being treated for UTI with Cipro x 3 days due to suspected UTI. Staff was unable to obtain urine sample as patient will toilet herself. No fever or chills reported. No pain or discomfort reported. Patient able to dress herself. No recent falls reported. Staff report patient behaviors improved with recent change in medications. She will occasionally pack up her belongings; staff is able to redirect. No recent ER visits or hospitalizations.   History obtained from review of EMR, discussion with primary team, and interview with family, facility staff/caregiver and/or Ms. Nordahl.  I reviewed available labs, medications, imaging, studies and related documents from the EMR.  Records reviewed and summarized above.   ROS  Patient unable to substantially contribute due to her dementia.   Physical Exam: Weight: 187 pounds Constitutional: NAD General: frail appearing EYES: anicteric sclera, lids intact, no discharge  ENMT: intact hearing, oral mucous membranes moist CV: S1S2, RRR, 1+ LE edema Pulmonary: LCTA, no increased work of breathing, no cough Abdomen: normo-active BS + 4 quadrants, soft and non tender GU: deferred MSK: moves all extremities, ambulatory Skin: warm and dry, no rashes or wounds on visible skin Neuro: generalized weakness, A & O to person Psych: non-anxious affect Hem/lymph/immuno: no widespread bruising   Thank you for the opportunity to participate in  the care of Ms. Goffredo.  The palliative care team will continue to follow. Please call our office at [phone] if we can be of additional assistance.   Ezekiel Slocumb, NP   COVID-19 PATIENT SCREENING TOOL Asked and negative response unless otherwise noted:   Have you had symptoms of covid, tested positive or been  in contact with someone with symptoms/positive test in the past 5-10 days? No
